# Patient Record
Sex: Female | Born: 1945 | Race: White | Hispanic: No | Marital: Married | Smoking: Never smoker
Health system: Northeastern US, Academic
[De-identification: ages and names within clinical notes are randomized; demographics above are authoritative.]

---

## 2015-12-08 ENCOUNTER — Ambulatory Visit

## 2015-12-08 LAB — HX VITAMIN D 25 HYDROXY LEVEL (RECOMMENDED)
CASE NUMBER: 2017135000810
HX VITAMIN D 25 OH LVL: 43 ng/mL — NL (ref 30.0–100.0)

## 2015-12-12 ENCOUNTER — Ambulatory Visit

## 2015-12-12 LAB — HX GLOMERULAR FILTRATION RATE (ESTIMATED)
CASE NUMBER: 2017139001103
HX AFN AMER GLOMERULAR FILTRATION RATE: 70 mL/min/{1.73_m2}
HX NON-AFN AMER GLOMERULAR FILTRATION RATE: 60 mL/min/{1.73_m2}

## 2015-12-12 LAB — HX CBC W/ INDICES
CASE NUMBER: 2017139001103
HX HCT: 46.2 % — NL (ref 36.0–47.0)
HX HGB: 15 g/dL — NL (ref 11.8–15.8)
HX MCH: 28 pg — NL (ref 27.0–31.0)
HX MCHC: 32.5 g/dL — NL (ref 32.0–36.0)
HX MCV: 88 fL — NL (ref 81.0–99.0)
HX MPV: 9.5 fL — NL (ref 7.4–11.5)
HX NRBC PERCENT: 0 % — NL
HX PLATELET: 278 10*3/uL — NL (ref 150.0–400.0)
HX RBC: 5.28 10*6/uL — ABNORMAL HIGH (ref 3.6–5.0)
HX RDW: 13 % — NL (ref 11.5–14.5)
HX WBC: 5.6 10*3/uL — NL (ref 3.7–11.2)

## 2015-12-12 LAB — HX COMPREHENSIVE METABOLIC PANEL
CASE NUMBER: 2017139001103
HX ALBUMIN LVL: 4.1 g/dL — NL (ref 3.5–5.0)
HX ALKALINE PHOSPHATASE: 85 U/L — NL (ref 45.0–117.0)
HX ALT: 24 U/L — NL (ref 6.0–78.0)
HX ANION GAP: 5 — NL (ref 3.0–11.0)
HX AST: 16 U/L — NL (ref 6.0–40.0)
HX BILIRUBIN TOTAL: 0.5 mg/dL — NL (ref 0.2–1.0)
HX BUN: 13 mg/dL — NL (ref 7.0–18.0)
HX CALCIUM LVL: 9.4 mg/dL — NL (ref 8.5–10.1)
HX CHLORIDE: 106 mmol/L — NL (ref 98.0–107.0)
HX CO2: 32 mmol/L — NL (ref 21.0–32.0)
HX CREATININE: 0.967 mg/dL — NL (ref 0.55–1.3)
HX GLUCOSE LVL: 94 mg/dL — NL (ref 65.0–110.0)
HX POTASSIUM LVL: 4.6 mmol/L — NL (ref 3.6–5.2)
HX SODIUM LVL: 143 mmol/L — NL (ref 136.0–145.0)
HX TOTAL PROTEIN: 7.5 g/dL — NL (ref 6.0–8.0)

## 2016-01-26 ENCOUNTER — Ambulatory Visit

## 2016-02-05 ENCOUNTER — Ambulatory Visit (HOSPITAL_BASED_OUTPATIENT_CLINIC_OR_DEPARTMENT_OTHER): Admitting: Family Medicine

## 2016-02-05 NOTE — Progress Notes (Signed)
** Progress Notes  **    ---    **Patient:** Hannah [name], Hannah [name]    **Account Number:** [id number]    **Provider:** Electa Sniff, M.D    **DOB:** Apr 29, 1946  **Age:** 70 Y  **Sex:** Female    **Date:** 02/05/2016    **Phone:** [phone]    **Address:** [address], [place], [id number]        * * *        **Subjective:**        ---      **Chief Complaints:**    ------      1\. [facility] - NEW PATIENT-see labs results pt brought in from 12/10/15. 2.  MSSP. 3. PHQ 9-completed and put in chart. 4. colonscopy hx-has had in last 5  years-per GI office, Dr. Arletta Bale did it in 2012-notes to follow, due  06/2016. . 5. Adacel-thinks she had done-need to check old records, PCV  13-thinks she had done, Rx for shingles vaccine-says had the vaccine and two  years later had the shingles across her abdoman. 6. discuss PPI info-been on  Omeprazole for 15-20 years, works perfectly for her. 7. Pressure in chest last  week, lasted seconds-was lying down, had eaten 1-2 hours before.    ------      **HPI:**    _Interim History_ :    70 year old female presents with c/o Consultations  01/20/16 derm consult-see  scanned note.  Marland Kitchen    Here for physical at age 70. new patient. hx of left acoustic neuroma, chronic  GERD, depression/anxiety, lichen sclerosis, hyperlipidemia. no recent hospital  stays. recently started on statin. chest pain a few weeks ago, transient.  better now.    _PHQ Form_ :    PHQ9 Form Little interest or pleasure in doing things Not at all, Feeling  down, depressed, hopeless Not at all, Trouble falling or staying asleep, or  sleeping to much Not at all, Feeling tired or little energy Not at all, Poor  appetite or overeating Not at all, Feeling bad about yourself-or that you are  a failure or let yourself or your family down Not at all, Trouble  concentrating on things such as reading the newspaper or watching television  Not at all, Moving or speaking so slowly that other people could have noticed.  or the opposite -  being so fidgety or restless that you have been moving  around a lot more than usual Not at all, Thoughts that you were better off  dead, or of hurting yourself in someway? Not at all, Total score 0\.    _* Updated MSSP_ :    c/o Influenza Vaccination (08/01-03/31) of current year) Documented: says has  every year. c/o Colorectal Cancer Screening (50-75) Performed: per pt has had  one in the last 5 years with Arletta Bale, mother with colon cancer . c/o Annual  Fall Screen (65 or Older) Have you fallen in the past year? No, Patient  screened for falls on: 02/05/2016. c/o Cost of Medication Cost of medication  discussed on 01/2016 no trouble .    ------      **ROS:**    _CONSTITUTIONAL_ :    no  Fever.  no  Chills.    _ENT_ :    no  Rhinorrhea. Ringing in Ears  yes  . Hearing Loss  yes  .    _RESPIRATORY_ :    no  Shortness of Breath.  no  Persistent Cough.  no  Wheezing.    _CARDIOLOGY_ :  c/o  Chest Pain.  no  Palpitations.  no  Dizziness.  no  Leg Edema.    _GASTROENTEROLOGY_ :    c/o  Heartburn.    _UROLOGY_ :    no  Dysuria.    _MUSCULOSKELETAL_ :    c/o  Back Pain.    _NEUROLOGY_ :    Positive for  left eyelid drop  .  no  Tingling/Numbness.    _PSYCHOLOGY_ :    c/o  Depression.  c/o  Anxiety.    _DERMATOLOGY_ :    Positive for  squamous cell cancer of leg.  .        ------      **Medical History:** Transient global amnesia, acoustic Neuroma,  Osteoporosis, chronic GERD, Migraines, Lichen sclerosis, Left breast ductal  hyperplasia, Tinnitus, Diverticulosis- 9 inches of colon removed, colonoscopy:  07/14/11 with Dr.Marinelli, repeat in 5 years. mother had colon cancer,  Plantar fasciitis.    ------      **Gyn History:** Periods : Hyst 2010. Sexual activity currently sexually  active. Last pap smear date 12/2013. Last mammogram date 06/2015 normal;  03/2013. Abnormal pap smear none. Date of Last Period 1996. STD none. HPV no.  HSV no. DES exposure no. HIV no. has own gyn was pt of jch. Other bone density  2013  osteoporosis.    ------      **OB History:** Total pregnancies 3\. Total living children 3\. Gravida 3\.  Para 3\. Miscarriage(s) 0\. Pregnancy # 1: NSVD. Pregnancy # 2: NSVD.  Pregnancy # 3 NSVD.    ------      **Surgical History:** tonsillectomy/adenoidectomy (as a child) ,  appendectomy 1971, rectal fissurectomies 1973, diagnostic lap (corpus luteum)  1981, Vag hyst BSO post colporrhaphy with culdoplasty (pelvic relaxation)  1999, acoustic neuroma .    ------      **Hospitalization/Major Diagnostic Procedure:** # 3 vaginal deliveried ,  See Surgical hx .    ------      **Family History:**    colon cancer in mother. father healthy.    ------      **Social History:** Smoking  Status: never smoker  , Patient counseled on  the dangers of tobacco use: 02/05/2016. no Alcohol. no Drug use. Exercise:  yes, walking, swimming off and on. Children: 3 girls. Occupation: retired.  Caffeine: decaf.    ------      **Medications:** Taking omeprazole 20 mg delayed release capsule 1 orally  qd, Taking paroxetine 30 mg tablet 1 orally qd, Taking nystatin topical 100000  units/g cream applied topically bid x 2 weeks and prn, Taking clobetasol  topical 0.05% ointment applied topically bid to affected area x 2 weeks and  prn, Taking Estrace Vaginal 1 gm cream intravaginally 2 x weekly, Taking  atorvastatin 20 mg tablet 1 tab(s) orally once a day, Taking risperidone 0.25  mg tablet 1 tab(s) orally qd, Taking calcium carbonate 600 mg tablet 2 tab(s)  orally daily, Taking Vitamin B12 500 mcg tablet 1 tab(s) orally once a day,  Taking Vitamin D3 2000 intl units tablet 1 tab(s) orally once a day, Taking  Zomig 5 mg tablet 1 tab(s) orally once a day, Taking lamotrigine 100 mg tablet  1 tab(s) orally qd, Taking lamotrigine 25 mg tablet 1 tab(s) orally qd,  Discontinued Lamictal 100 mg tablet 1 tab(s) orally qd, Discontinued Respirol  , Discontinued aspirin 81 mg tablet, chewable 1 orally qd, Medication List  reviewed and  reconciled with the patient    ------      **  Allergies:** Codeine, Sulfamethizole.    ------        **Objective:**        ---      **Vitals:** HR 94, BP 130/80, O2 Sat. 95%RA, Ht 59.5, Wt 178, BMI  35.35  .    ------      **Examination:    ** _General Examination:_    General Appearance:  alert and oriented  ,  well nourished and hydrated  ,  appropriate attire and affect  .    HEENT:  left eyelid droop, complete hearing loss left ear  .    Oral cavity:  clear  ,  moist mucus membranes  .    Heart:  RRR  ,  no murmurs  .    Lungs:  clear to auscultation bilaterally  ,  no wheezes/rhonchi/rales  .    Abdomen:  soft, NT/ND, BS present  ,  no masses palpated  ,  no  hepatosplenomegaly  .    Back:  normal ROM of spine  .    Female Genitourinary:  per gyn  .    Extremities  no clubbing, no edema  .    Peripheral pulses:  normal (2+) bilaterally  .    Skin:  moist, warm  .    Neurologic Exam:  balance disturbance. left hearing loss and left eyelid droop  .        ------            **Assessment:**        ---      **Assessment:**    1\. Acoustic neuroma - D33.3 (Primary)    2\. Hyperlipidemia - E78.5    3\. Family hx of colon cancer - Z80.0    4\. Osteopenia determined by x-ray - M85.80    5\. Chronic GERD - K21.9    6\. Depression with anxiety - F41.8    7\. Vitamin D deficiency - E55.9    8\. Lichen sclerosus - L90.0    9\. Adult general medical exam - Z00.00    ------        **Plan:**        ---        **1\. Acoustic neuroma**    Notes: left sided. had surgery in past. left with left ear deafness, balance  disturbance, left eyelid droop.    ---    **2\. Hyperlipidemia**    _LAB: Hepatic Function Panel - [facility]_ protein 8.3    Albumin Lvl    4.0      3.5-5.0 - Gm/dL    ------------    Alk Phos    87      45-117 - Units/L    ------------    ALT    29      6-78 - Units/L    ------------    AST    24      6-40 - Units/L    ------------    Bili Direct    0.1      0.0-0.3 - mg/dL     ------------    Bili Total    0.6      0.2-1.0 - mg/dL    ------------    Total Protein    8.3    H    6.0-8.0 - Gm/dL    ------------    _LAB: Lipid Panel_ 266/65/169/161  Chol    266    H    140-200 - mg/dL    ------------  HDL    65    H    41-60 - mg/dL    ------------    LDL    169    H    0-129 - mg/dL    ------------    Status    Fasting      \-    ------------    Trig    161    H    0-149 - mg/dL    ------------        _Imaging: EKG_ Normal    Dejay Kronk G 02/06/2016 10:08:01 AM >    ------        Notes: lipids fine when last checked. recheck off statin. EKG normal.    **3\. Family hx of colon cancer**    Notes: due for colonoscopy in December.    **4\. Osteopenia determined by x-ray**    Notes: calcium, exercise, vitamin d.    **5\. Chronic GERD**    Notes: on PPI long term, unable to stop.    **6\. Depression with anxiety**    Notes: paxil, lamictal, can try to taper respirdol, causes weight gain and  high lipids.    **7\. Vitamin D deficiency**    _LAB: Vitamin D 25 Hydroxy Level_ 41    Vitamin D 25 OH Lvl    41      30-100 - nGm/ml    ------------        Notes: recheck. was on supplements.    Referral ZO:[facility]    Reason:needs repeat colonoscopyfam hx of colon cancer        **8\. Lichen sclerosus**    Notes: per gyn.    **9\. Others**    Notes: Patient Educated with: TDAP - ADACEL.pdf (TDAP - ADACEL.pdf) Patient  Educated with: Shingles.pdf (Shingles.pdf) Patient Educated with:  Pneumococcal- PCV13.pdf (Pneumococcal- PCV13.pdf) generally healthy. mammo up  to date. colonoscopy due December for fam hx of colon cancer. recommend diet,  exercise program. recent labs reviewed. all normal.      **Procedure Codes:** 93000 ECG W/ INTERP    ------      **Follow Up:** 3 Months, prn    ------    ---    ---          **Provider:** Electa Sniff, M.D    ---    **Patient:** Kantz, Shalane A **DOB:** 10/01/1945 **Date:**  02/05/2016    ---    Electronically signed by Kyung Rudd on 05/24/2016 at 06:21 PM EDT    Sign off status: Completed

## 2016-02-23 ENCOUNTER — Ambulatory Visit: Admitting: Family Medicine

## 2016-02-23 LAB — HX HEPATIC FUNCTION PANEL
CASE NUMBER: 2017212000898
HX ALBUMIN LVL: 4 g/dL — NL (ref 3.5–5.0)
HX ALKALINE PHOSPHATASE: 87 U/L — NL (ref 45.0–117.0)
HX ALT: 29 U/L — NL (ref 6.0–78.0)
HX AST: 24 U/L — NL (ref 6.0–40.0)
HX BILIRUBIN DIRECT: 0.1 mg/dL — NL (ref 0.0–0.3)
HX BILIRUBIN TOTAL: 0.6 mg/dL — NL (ref 0.2–1.0)
HX TOTAL PROTEIN: 8.3 g/dL — ABNORMAL HIGH (ref 6.0–8.0)

## 2016-02-23 LAB — HX LIPID PANEL
CASE NUMBER: 2017212000898
HX CHOL: 266 mg/dL — ABNORMAL HIGH (ref 140.0–200.0)
HX HDL: 65 mg/dL — ABNORMAL HIGH (ref 41.0–60.0)
HX LDL: 169 mg/dL — ABNORMAL HIGH (ref 0.0–129.0)
HX TRIG: 161 mg/dL — ABNORMAL HIGH (ref 0.0–149.0)

## 2016-02-23 LAB — HX VITAMIN D 25 HYDROXY LEVEL (RECOMMENDED)
CASE NUMBER: 2017212000898
HX VITAMIN D 25 OH LVL: 41 ng/mL — NL (ref 30.0–100.0)

## 2016-02-24 ENCOUNTER — Ambulatory Visit (HOSPITAL_BASED_OUTPATIENT_CLINIC_OR_DEPARTMENT_OTHER): Admitting: Family Medicine

## 2016-02-24 NOTE — Progress Notes (Signed)
* * *        **  Hannah [name], Hannah [name]**    ------    48 Y old Female, DOB: [birth year]    [address], [place], [place] [address]    Home: [phone]    Provider: Cheryle Horsfall        * * *    Telephone Encounter    ---    Answered by    Cheryle Horsfall    Date: 02/24/2016        Time: 05:41 PM    Reason    results    ------            Message                      lipids high.  LFTs OK except protein high.  vitamin D better                Action Taken    Montrail Mehrer G 02/24/2016 5:50:46 PM > would restart statin.  called. she has been feeling poorly with headaches and fatigue. consider Lyme.  coming in Monday                * * *                ---          * * *          Patient: Hannah [name], Hannah [name] DOB: October 10, 1945 Provider: Cheryle Horsfall  02/24/2016    ---    Note generated by eClinicalWorks EMR/PM Software ([email or web address])

## 2016-02-25 ENCOUNTER — Ambulatory Visit (HOSPITAL_BASED_OUTPATIENT_CLINIC_OR_DEPARTMENT_OTHER): Admitting: Family Medicine

## 2016-02-25 ENCOUNTER — Ambulatory Visit: Admitting: Family Medicine

## 2016-02-25 LAB — HX CBC W/ DIFF
CASE NUMBER: 2017214002300
HX HCT: 49.7 % — ABNORMAL HIGH (ref 36.0–47.0)
HX HGB: 16.1 g/dL — ABNORMAL HIGH (ref 11.8–15.8)
HX MCH: 28 pg — NL (ref 27.0–31.0)
HX MCHC: 32.4 g/dL — NL (ref 32.0–36.0)
HX MCV: 88 fL — NL (ref 81.0–99.0)
HX MPV: 9.6 fL — NL (ref 7.4–11.5)
HX NRBC PERCENT: 0 % — NL
HX PLATELET: 320 10*3/uL — NL (ref 150.0–400.0)
HX RBC: 5.68 10*6/uL — ABNORMAL HIGH (ref 3.6–5.0)
HX RDW: 13.2 % — NL (ref 11.5–14.5)
HX WBC: 7 10*3/uL — NL (ref 3.7–11.2)

## 2016-02-25 LAB — HX .AUTOMATED DIFF
CASE NUMBER: 2017214002300
HX ABSOLUTE NEUTRO COUNT: 3900 /mm3
HX BASOPHILS: 1 % — NL (ref 0.0–1.0)
HX EOSINOPHILS: 1 % — NL (ref 0.0–3.0)
HX IMMATURE GRANULOCYTES: 0 % — NL (ref 0.0–2.0)
HX LYMPHOCYTES: 32 % — NL (ref 22.0–40.0)
HX MONOCYTES: 10 % — NL (ref 0.0–11.0)
HX NEU CT MEASURED: 3.9
HX NEUTROPHILS: 56 % — NL (ref 40.0–71.0)

## 2016-02-25 LAB — HX TSH
CASE NUMBER: 2017214002300
HX 3RD GEN TSH: 1.02 u[IU]/mL — NL (ref 0.358–3.74)

## 2016-02-25 NOTE — Progress Notes (Signed)
* * *        **  [name], [name]**    ------    88 Y old Female, DOB: [birth year]    [address], [place], [place] [address]    Home: [phone]    Provider: Cheryle Horsfall        * * *    Telephone Encounter    ---    Answered by    Cheryle Horsfall    Date: 02/25/2016        Time: 04:17 PM    Message                      paxil        ------            Action Taken    Brynnlee Cumpian G 02/25/2016 5:26:50 PM > , erefill done            Refills    Start Paroxetine Hydrochloride tablet, 40 mg, orally, 90, 1  tab(s), once a day, Refills=1    ------          * * *                ---          * * *          Patient: [name], [name] DOB: 1945/10/27 Provider: Cheryle Horsfall  02/25/2016    ---    Note generated by eClinicalWorks EMR/PM Software ([email or web address])

## 2016-02-25 NOTE — Progress Notes (Signed)
** Progress Note  **    ---    **Patient:** Hannah [name], Hannah [name]    **Account Number:** [id number]    **Provider:** Electa Sniff, M.D    **DOB:** July 28, 1945  **Age:** 59 Y  **Sex:** Female    **Date:** 02/25/2016    **Phone:** [phone]    **Address:** [address], [place], [phone]        * * *        **Subjective:**        ---      **Chief Complaints:**    ------      1\. Wound on right leg not healing correctly. 2. Pt states she is also  sweatting alot feels dizzy at times.    ------      **HPI:**    _Sick Visit_ :    as above. has had right leg wound for the past 3 weeks. tender, not healing.  also has had posterior headaches. fatigue. no motivation. tearful at times. no  fever, no chills. does have sweats. tx for depression over the years. did very  well on paxil initially, worse when she tapered this medication.    ------      **ROS:**    _CONSTITUTIONAL_ :    no  Fever.  no  Chills.    _ENT_ :    no  Rhinorrhea. Ringing in Ears  yes  . Hearing Loss  yes  .    _RESPIRATORY_ :    no  Shortness of Breath.  no  Persistent Cough.  no  Wheezing.    _CARDIOLOGY_ :    c/o  Chest Pain.  no  Palpitations.  no  Dizziness.  no  Leg Edema.    _GASTROENTEROLOGY_ :    c/o  Heartburn.    _UROLOGY_ :    no  Dysuria.    _MUSCULOSKELETAL_ :    c/o  Back Pain.    _NEUROLOGY_ :    Positive for  left eyelid drop  .  no  Tingling/Numbness.    _PSYCHOLOGY_ :    c/o  Depression.  c/o  Anxiety.    _DERMATOLOGY_ :    Positive for  squamous cell cancer of leg.  .        ------      **Medical History:** Transient global amnesia, acoustic Neuroma,  Osteoporosis, chronic GERD, Migraines, Lichen sclerosis, Left breast ductal  hyperplasia, Tinnitus, Diverticulosis- 9 inches of colon removed, colonoscopy:  07/14/11 with Dr.Marinelli, repeat in 5 years. mother had colon cancer,  Plantar fasciitis.    ------      **Gyn History:** Periods : Hyst 2010. Sexual activity currently sexually  active. Last pap smear date 12/2013. Last  mammogram date 06/2015 normal;  03/2013. Abnormal pap smear none. Date of Last Period 1996. STD none. HPV no.  HSV no. DES exposure no. HIV no. has own gyn was pt of jch. Other bone density  2013 osteoporosis.    ------      **OB History:** Total pregnancies 3\. Total living children 3\. Gravida 3\.  Para 3\. Miscarriage(s) 0\. Pregnancy # 1: NSVD. Pregnancy # 2: NSVD.  Pregnancy # 3 NSVD.    ------      **Family History:**    colon cancer in mother. father healthy.    ------      **Social History:** Smoking  Status: never smoker  , Patient counseled on  the dangers of tobacco use: 02/05/2016. no Alcohol. no Drug use. Exercise:  yes, walking, swimming off and  on. Children: 3 girls. Occupation: retired.  Caffeine: decaf.    ------      **Medications:** Taking omeprazole 20 mg delayed release capsule 1 orally  qd, Taking paroxetine 30 mg tablet 1 orally qd, Taking nystatin topical 100000  units/g cream applied topically bid x 2 weeks and prn, Taking clobetasol  topical 0.05% ointment applied topically bid to affected area x 2 weeks and  prn, Taking Estrace Vaginal 1 gm cream intravaginally 2 x weekly, Taking  risperidone 0.25 mg tablet 1 tab(s) orally qd, Taking calcium carbonate 600 mg  tablet 2 tab(s) orally daily, Taking Vitamin B12 500 mcg tablet 1 tab(s)  orally once [name] day, Taking Vitamin D3 2000 intl units tablet 1 tab(s) orally  once [name] day, Taking Zomig 5 mg tablet 1 tab(s) orally once [name] day, Taking  lamotrigine 100 mg tablet 1 tab(s) orally qd, Taking lamotrigine 25 mg tablet  1 tab(s) orally qd, Discontinued atorvastatin 20 mg tablet 1 tab(s) orally  once [name] day, Medication List reviewed and reconciled with the patient    ------      **Allergies:** Codeine, Sulfamethizole.    ------        **Objective:**        ---      **Vitals:** BP 132/84, Temp 98.7, Ht 59.5, Wt 175, Wt Change -3 lb, BMI  34.75  .    ------      **Examination:    ** _General Examination:_    General Appearance:  alert and  oriented  ,  well nourished and hydrated  ,  appropriate attire and affect  .    HEENT:  left eyelid droop, complete hearing loss left ear  .    Oral cavity:  clear  ,  moist mucus membranes  .    Heart:  RRR  ,  no murmurs  .    Lungs:  clear to auscultation bilaterally  ,  no wheezes/rhonchi/rales  .    Abdomen:  soft, NT/ND, BS present  ,  no masses palpated  ,  no  hepatosplenomegaly  .    Back:  normal ROM of spine  .    Female Genitourinary:  per gyn  .    Extremities  no clubbing, no edema  .    Peripheral pulses:  normal (2+) bilaterally  .    Skin:  moist, warm  .    Neurologic Exam:  balance disturbance. left hearing loss and left eyelid droop  .        ------            **Assessment:**        ---      **Assessment:**    1\. Skin infection - L08.9 (Primary)    2\. Depression - F32.9    ------        **Plan:**        ---        **1\. Skin infection**    _LAB: CBC w/ Differential - [facility]_   Hct    49.7    H    36.0-47.0 - %    ------------    Hgb    16.1    H    11.8-15.8 - Gm/dL    ------------    MCH    28      27-31 - pGm    ------------    MCHC    32.4      32.0-36.0 - Gm/dL    ------------  MCV    88      81-99 - fL    ------------    MPV    9.6      7.4-11.5 - fL    ------------    Platelet    320      150-400 - thous/mm3    ------------    RBC    5.68    H    3.60-5.00 - Mil/mm3    ------------    RDW    13.2      11.5-14.5 - %    ------------    WBC    7.0      3.7-11.2 - thous/mm3    ------------    _LAB: Lyme Antibodies_    Notes: 3 weeks of nonhealing wound right leg. nondraining. check Lyme with  wound, headache, fatigue.    **2\. Depression**    _LAB: 3 Gen Sensitive TSH_   3rd Gen TSH    1.020      0.358-3.740 -  mclU/ml    ------------        Notes: clearly depressed. increase paxil to 40 mg and counselling recommended.  check labs/thyroid and tx if abnormal.    **3\. Others**    Notes: christine svenson is her [place]  psych NP: [phone]  [email or web address] gary rose is her counsellor: [phone].      **Follow Up:** after test reports return    ------    ---    ---          **Provider:** Electa Sniff, M.D    ---    **Patient:** Hannah [name], Hannah [name] **DOB:** [birth year] **Date:** 02/25/2016    ---    Electronically signed by Electa Sniff , MD on 02/26/2016 at 08:08 AM EDT    Sign off status: Completed

## 2016-02-26 ENCOUNTER — Ambulatory Visit (HOSPITAL_BASED_OUTPATIENT_CLINIC_OR_DEPARTMENT_OTHER): Admitting: Family Medicine

## 2016-02-27 ENCOUNTER — Ambulatory Visit (HOSPITAL_BASED_OUTPATIENT_CLINIC_OR_DEPARTMENT_OTHER): Admitting: Family Medicine

## 2016-02-27 LAB — HX LYME ANTIBODIES W/ RFLX IB
CASE NUMBER: 2017214002300
HX LYME ABS TOT: 0.27 {index}

## 2016-02-27 NOTE — Progress Notes (Signed)
* * *        **  [name], [name]**    ------    6 Y old Female, DOB: 08-28-1945    [address], [place], [place] [address]    Home: [phone]    Provider: Cheryle Horsfall        * * *    Telephone Encounter    ---    Answered by    Cheryle Horsfall    Date: 02/27/2016        Time: 08:09 AM    Reason    results    ------            Message                      cbc normal and Lyme negative                Action Taken    Viraat Vanpatten G 03/01/2016 7:47:51 AM > will discuss today when  husband comes in                * * *                ---          * * *          Patient: [name], [name] DOB: 1945/09/11 Provider: Cheryle Horsfall  02/27/2016    ---    Note generated by eClinicalWorks EMR/PM Software ([email or web address])

## 2016-03-01 ENCOUNTER — Ambulatory Visit (HOSPITAL_BASED_OUTPATIENT_CLINIC_OR_DEPARTMENT_OTHER): Admitting: Family Medicine

## 2016-03-01 NOTE — Progress Notes (Signed)
* * *        **  Hannah [name], Hannah [name]**    ------    89 Y old Female, DOB: January 17, 1946    [address], [place], [place] [address]    Home: [phone]    Provider: Cheryle Horsfall        * * *    Web Encounter    ---    Answered by    Staff, Clinical    Date: 03/01/2016        Time: 06:41 AM    Caller    Tiann Saville    ------            Reason    New Refill Request            Message                      Please answer the following questions            Medication:  omeprazole 20 mg 1  orally  once [name] day   with no refill(s)      Number of refills requested:   90 Capsules (90 day) supply                              Contact phone number (area code & phone number):            Comments:                Action Taken    Lynden Flemmer G 03/01/2016 7:37:40 AM > erx done            Refills    Start omeprazole delayed release capsule, 20 mg, orally, 90, 1  cap(s), once [name] day, Refills=1    ------          * * *                ---          * * *          Patient: Hannah [name], Hannah [name] DOB: 12-Jul-1946 Provider: Cheryle Horsfall  03/01/2016    ---    Note generated by eClinicalWorks EMR/PM Software ([email or web address])

## 2016-03-04 ENCOUNTER — Ambulatory Visit

## 2016-03-12 ENCOUNTER — Ambulatory Visit (HOSPITAL_BASED_OUTPATIENT_CLINIC_OR_DEPARTMENT_OTHER): Admitting: Family Medicine

## 2016-03-12 NOTE — Progress Notes (Signed)
* * *        **  [name], [name]**    ------    50 Y old Female, DOB: [birth year]    [address], [place], [place] [address]    Home: [phone]    Provider: Cheryle Horsfall        * * *    Telephone Encounter    ---    Answered by    Cheryle Horsfall    Date: 03/12/2016        Time: 08:48 AM    Action Taken    Cheryle Horsfall 03/12/2016 8:48:58 AM > LM with her psych  nurse practitioner about paxil    ------                * * *                ---          * * *          Patient: Hannah [name], Hannah [name] DOB: 1945-09-06 Provider: Cheryle Horsfall  03/12/2016    ---    Note generated by eClinicalWorks EMR/PM Software ([email or web address])

## 2016-06-08 ENCOUNTER — Ambulatory Visit (HOSPITAL_BASED_OUTPATIENT_CLINIC_OR_DEPARTMENT_OTHER): Admitting: Family Medicine

## 2016-06-08 NOTE — Progress Notes (Signed)
* * *        **  [name], [name]**    ------    3 Y old Female, DOB: 1946-04-10    [address], [place], [place] [address]    Home: [phone]    Provider: Cheryle Horsfall        * * *    Telephone Encounter    ---    Answered by    Idelia Salm    Date: 06/08/2016        Time: 08:31 AM    Message                      Pt. called to request a refill on this med,It says discontinued when I looked to see when it was last filled?      last visit 02/25/16        ------            Action Taken    Orabelle Rylee G 06/08/2016 8:40:51 AM > was off it, we  restarted when cholesterol was high. I think she lives in [place] for the  winter though. Is above pharmacy correct? Luna,Deborah 06/08/2016 9:07:28 AM >  Yes I checked with her,she said [facility] G 06/08/2016  10:03:31 AM > erx done            Refills    Refill atorvastatin tablet, 20 mg, orally, 90, 1 tab(s), once a  day, Refills=3    ------          * * *                ---          * * *          Patient: [name], [name] DOB: 12-29-1945 Provider: Cheryle Horsfall  06/08/2016    ---    Note generated by eClinicalWorks EMR/PM Software ([email or web address])

## 2016-06-09 ENCOUNTER — Ambulatory Visit (HOSPITAL_BASED_OUTPATIENT_CLINIC_OR_DEPARTMENT_OTHER): Admitting: Family Medicine

## 2016-06-09 NOTE — Progress Notes (Signed)
* * *        **  [name], [name]**    ------    80 Y old Female, DOB: 1945/12/24    [address], [place], [place] [address]    Home: [phone]    Provider: Cheryle Horsfall        * * *    Web Encounter    ---    Answered by    Jule Economy    Date: 06/09/2016        Time: 12:22 PM    Caller    Dessa Papaleo    ------            Reason    Update Demographics - Additional Info            Message                      Please Update Demographic Information                    * * *                ---          * * *          Patient: [name], [name] DOB: [birth year] Provider: Cheryle Horsfall  06/09/2016    ---    Note generated by eClinicalWorks EMR/PM Software ([email or web address])

## 2016-06-09 NOTE — Progress Notes (Signed)
* * *        **  [name], Hannah [name]**    ------    38 Y old Female, DOB: 27-Sep-1945    [address], [place], [place] [address]    Home: [phone]    Provider: Cheryle Horsfall        * * *    Web Encounter    ---    Answered by    Jule Economy    Date: 06/09/2016        Time: 12:22 PM    Caller    Noga Newkirk    ------            Reason    Update Demographics - Personal Info            Message                      Please Update Demographic Information                    * * *                ---          * * *          Patient: [name], Hannah [name] DOB: 1945-10-30 Provider: Cheryle Horsfall  06/09/2016    ---    Note generated by eClinicalWorks EMR/PM Software ([email or web address])

## 2016-07-17 ENCOUNTER — Ambulatory Visit (HOSPITAL_BASED_OUTPATIENT_CLINIC_OR_DEPARTMENT_OTHER): Admitting: Family Medicine

## 2016-07-17 ENCOUNTER — Ambulatory Visit: Admitting: Family Medicine

## 2016-07-22 ENCOUNTER — Ambulatory Visit (HOSPITAL_BASED_OUTPATIENT_CLINIC_OR_DEPARTMENT_OTHER): Admitting: Family Medicine

## 2016-07-27 ENCOUNTER — Ambulatory Visit

## 2016-12-06 ENCOUNTER — Ambulatory Visit (HOSPITAL_BASED_OUTPATIENT_CLINIC_OR_DEPARTMENT_OTHER): Admitting: Family Medicine

## 2016-12-06 NOTE — Progress Notes (Signed)
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  States that the provider she saw recommended pain management to her.  She wants to get referrals/recommendations from you.  States this has been going on for about 2 months now.  I scheduled her an appt with you 12/15/16 at 10:30am, as she is returning from [facility] later this week.  I asked her if she had MRI report with her, she states she is going to contact the office where she was seen and have them fax information to you.                Action Taken    Lilymarie Scroggins G 12/06/2016 3:07:36 PM > OK will set her up  with br Bhatt or dr Gerilyn Nestle when she comes in                * * *                ---          * * *          Patient: [name], Hannah [name] DOB: [birth year] Provider: Cheryle Horsfall  12/06/2016    ---    Note generated by eClinicalWorks EMR/PM Software ([email or web address])

## 2016-12-15 ENCOUNTER — Ambulatory Visit (HOSPITAL_BASED_OUTPATIENT_CLINIC_OR_DEPARTMENT_OTHER): Admitting: Family Medicine

## 2016-12-15 NOTE — Progress Notes (Signed)
** Progress Note  **    ---    **Patient:** Hannah [name], Hannah [name]    **Account Number:** [id number]    **Provider:** Electa Sniff, M.D    **DOB:** [birth year]  **Age:** 24 Y  **Sex:** Female    **Date:** 12/15/2016    **Phone:** [phone]    **Address:** [address], [place], [id number]        * * *        **Subjective:**        ---      **Chief Complaints:**    ------      1\. dx w/disc bulge/stenosis in [place], looking for referral to ortho / PHQ9  /[facility]. 2. Due for repeat colonoscopy. 3. Has reports with her.    ------      **HPI:**    _Follow-up_ :    back from [place]. has had three months of back pain. radiates down left >  right leg. some tingling, numbness, weakness. was treated breifly with  steorids and saw a chiropractor. no relief. no hx of injury. 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    _Health Screening_ :    Medication Reconciliation Post-Discharge Has the patient recently been  discharged from an inpatient faclity (hospital, skilled nursing home, rehab)  and seen within 30 days? No. Fall Risk Screening Have you fallen in the past  year? _. Diabetes Composite Is the patient's A1C in control (</=9)? _. CAD  and/or IVD Is the patient on an aspirin or anti-platelet? _. Depression Was  the patient screened for depression in the current year? _. Breast Cancer  Screening Patient screened within 2 years: Yes, Normal, Date of examination:  07/20/2016. Colorectal Cancer Screening Patient had: Colonoscopy within 9  years, Performed on: 07/14/11. Influenza Vaccination (08/01-03/31) Documented  for current year(administered in office or elsewhere) _. Pneumonia Vaccination  Documented (administered in office or elsewhere): _. BMI Screening (18.5-24.9)  Within range: _. Tobacco Use Was the patient screened for tobacco use (current  year): _. Statin Therapy Does the patient have any of the following: Familial  or pure hypercholesterolemia (E78.0,E78.00,E78.01), Is the patient on a  statin? Yes.  Cost of Medication Cost of medication discussed on: / .    ------      **Medical History:** Transient global amnesia, acoustic Neuroma,  Osteoporosis, chronic GERD, Migraines, Lichen sclerosis, Left breast ductal  hyperplasia, Tinnitus, Diverticulosis- 9 inches of colon removed, colonoscopy:  07/14/11 with Dr.Marinelli, repeat in 5 years. mother had colon cancer,  Plantar fasciitis.    ------      **Gyn History:** Periods : Hyst 2010. Sexual activity currently sexually  active. Last pap smear date 12/2013. Last mammogram date 07/20/16 normal,  06/2015 normal, 03/2013. Abnormal pap smear none. Date of Last Period 1996. STD  none. HPV no. HSV no. DES exposure no. HIV no. has own gyn was pt of jch.  Other bone density 2013 osteoporosis.    ------      **OB History:** Total pregnancies 3\. Total living children 3\. Gravida 3\.  Para 3\. Miscarriage(s) 0\. Pregnancy # 1: NSVD. Pregnancy # 2: NSVD.  Pregnancy # 3 NSVD.    ------      **Surgical History:** tonsillectomy/adenoidectomy (as a child) ,  appendectomy 1971, rectal fissurectomies 1973, diagnostic lap (corpus luteum)  1981, Vag hyst BSO post colporrhaphy with culdoplasty (pelvic relaxation)  1999, acoustic neuroma .    ------      **Hospitalization/Major Diagnostic Procedure:** # 3 vaginal deliveried ,  See Surgical  hx .    ------      **Family History:**    colon cancer in mother. father healthy.    ------      **Social History:** Smoking  Status: never smoker  , Patient counseled on  the dangers of tobacco use: 02/05/2016. no Alcohol. no Drug use. Exercise:  yes, walking, swimming off and on. Children: 3 girls. Occupation: retired.  Caffeine: decaf.    ------      **Medications:** Taking omeprazole 20 mg delayed release capsule 1 orally  qd, Taking paroxetine 10 mg tablet 1 orally qd, Taking clobetasol topical  0.05% ointment applied topically bid to affected area x 2 weeks and prn,  Taking risperidone 0.25 mg tablet 1 tab(s) orally qd, Taking  calcium carbonate  600 mg tablet 2 tab(s) orally daily, Taking Vitamin B12 500 mcg tablet 1  tab(s) orally once a day, Taking Vitamin D3 2000 intl units tablet 1 tab(s)  orally once a day, Taking Zomig 5 mg tablet 1 tab(s) orally once a day, Taking  lamotrigine 100 mg tablet 1 tab(s) orally qd, Taking lamotrigine 25 mg tablet  1 tab(s) orally qd, Taking atorvastatin 20 mg tablet 1 tab(s) orally once a  day, Taking duloxetine 30 mg delayed release capsule orally , Discontinued  nystatin topical 100000 units/g cream applied topically bid x 2 weeks and prn,  Discontinued Estrace Vaginal 1 gm cream intravaginally 2 x weekly,  Discontinued Paroxetine Hydrochloride 40 mg tablet 1 tab(s) orally once a day,  Discontinued omeprazole 20 mg delayed release capsule 1 cap(s) orally once a  day    ------      **Allergies:** Codeine, Sulfamethizole.    ------        **Objective:**        ---      **Vitals:** BP 128/78, Temp 98.7, Ht 59.5, Wt 182, Wt Change 7 lb, BMI  36.14  .    ------      **Examination:    ** _General Examination:_    General  uncomfortable. walks without a cane.  Marland Kitchen    HEENT:  clear conjunctiva  ,  TM's clear and flat bilaterally  ,  nose clear  ,  oropharynx clear with MMM  .    Heart:  RRR  ,  no murmurs  .    Lungs:  clear to auscultation bilaterally  ,  no wheezes/rhonchi/rales  .    Abdomen:  soft, NT/ND, BS present  ,  no masses palpated  ,  no  hepatosplenomegaly  .    Back:  pain with ROM. reflexes diminished. good distal pulse, sensation,  strength. able to toe and heel walk  .    Extremities  tender over lateral left hip  .        ------            **Assessment:**        ---      **Assessment:**    1\. Left lumbar radiculopathy - M54.16 (Primary)    2\. Trochanteric bursitis of left hip - M70.62    ------        **Plan:**        ---        **1\. Left lumbar radiculopathy**    Start Medrol dose pack, as per package, as directed, oral, as per pack, one  dose pack, Refills 0 .    Notes: MRI  reviewed. start formal PT. refer to Dr Nona Dell for consideration of  ESI.  Referral ZO:[facility]    Reason:left leg radiculopathy.        ---    **2\. Trochanteric bursitis of left hip**    Notes: ice, NSAIDs, consider local steroid injection.      **Follow Up:** with ortho    ------    ---    ---          **Provider:** Electa Sniff, M.D    ---    **Patient:** [name], [name] **DOB:** Nov 28, 1945 **Date:** 12/15/2016    ---    Electronically signed by Electa Sniff , MD on 12/20/2016 at 12:48 PM EDT    Sign off status: Completed

## 2016-12-21 ENCOUNTER — Ambulatory Visit

## 2016-12-24 ENCOUNTER — Ambulatory Visit (HOSPITAL_BASED_OUTPATIENT_CLINIC_OR_DEPARTMENT_OTHER): Admitting: Family Medicine

## 2016-12-24 NOTE — Progress Notes (Signed)
* * *        **  [name], [name]**    ------    82 Y old Female, DOB: 1946/05/28    [address], [place], [place] [address]    Home: [phone]    Provider: Cheryle Horsfall        * * *    Telephone Encounter    ---    Answered by    Rock Nephew    Date: 12/24/2016        Time: 10:53 AM    Caller    pt    ------            Reason    refill            Message                      last cpe 01/2016, last refill 03/01/2016.                Action Taken    Braysen Cloward G 12/26/2016 8:49:42 PM > , erefill done            Refills    Start omeprazole delayed release capsule, 20 mg, orally, 90, 1  cap(s), once a day, Refills=2    ------          * * *                ---          * * *          Patient: [name], [name] DOB: 09-22-1945 Provider: Cheryle Horsfall  12/24/2016    ---    Note generated by eClinicalWorks EMR/PM Software ([email or web address])

## 2016-12-27 ENCOUNTER — Ambulatory Visit (HOSPITAL_BASED_OUTPATIENT_CLINIC_OR_DEPARTMENT_OTHER): Admitting: Family Medicine

## 2016-12-27 NOTE — Progress Notes (Signed)
* * *        **  [name], [name]**    ------    88 Y old Female, DOB: 1945/08/23    [address], [place], [place] [address]    Home: [phone]    Provider: Cheryle Horsfall        * * *    Telephone Encounter    ---    Answered by    Cyndra Numbers    Date: 12/27/2016        Time: 11:03 AM    Caller    pt    ------            Reason    Refills            Action Taken    Tyeisha Dinan G 12/27/2016 2:02:01 PM > , erefill done            Refills    Refill omeprazole delayed release capsule, 20 mg, orally, 90, 1  cap(s), once a day, Refills=2    ------          * * *                ---          * * *          Patient: [name], [name] DOB: [birth year] Provider: Cheryle Horsfall  12/27/2016    ---    Note generated by eClinicalWorks EMR/PM Software ([email or web address])

## 2017-03-31 ENCOUNTER — Ambulatory Visit (HOSPITAL_BASED_OUTPATIENT_CLINIC_OR_DEPARTMENT_OTHER): Admitting: Family Medicine

## 2017-03-31 NOTE — Progress Notes (Signed)
* * *        **Hannah [name]**    ------    18 Y old Female, DOB: September 11, 1945    [address], [place], [place] [address]    Home: [phone]    Provider: Cheryle Horsfall        * * *    Telephone Encounter    ---    Answered by    Cyndra Numbers    Date: 03/31/2017        Time: 11:23 AM    Caller    pt    ------            Reason    Refills            Action Taken    Hannah [name] 03/31/2017 1:36:55 PM > , erefill done            Refills    Refill Zomig tablet, 5 mg, orally, 18, 1 tab(s), once [name] day prn  migraine, Refills=1    ------          * * *              * * *        ---        Reason for Appointment    ---      1\. Refills    ---      Current Medications    ---    Unknown      * omeprazole 20 mg delayed release capsule 1 cap(s) orally once [name] day     ---    * Medrol dose pack as per package as directed oral as per pack     ---    * omeprazole 20 mg delayed release capsule 1 orally qd     ---    * paroxetine 10 mg tablet 1 orally qd     ---    * clobetasol topical 0.05% ointment applied topically bid to affected area x 2 weeks and prn     ---    * risperidone 0.25 mg tablet 1 tab(s) orally qd     ---    * calcium carbonate 600 mg tablet 2 tab(s) orally daily     ---    * Vitamin B12 500 mcg tablet 1 tab(s) orally once [name] day     ---    * Vitamin D3 2000 intl units tablet 1 tab(s) orally once [name] day     ---    * Zomig 5 mg tablet 1 tab(s) orally once [name] day     ---    * lamotrigine 100 mg tablet 1 tab(s) orally qd     ---    * lamotrigine 25 mg tablet 1 tab(s) orally qd     ---    * atorvastatin 20 mg tablet 1 tab(s) orally once [name] day     ---    * duloxetine 30 mg delayed release capsule orally     ---      Past Medical History    ---      Transient global amnesia    ---    acoustic Neuroma    ---    Osteoporosis    ---    chronic GERD    ---    Migraines    ---    Lichen sclerosis    ---    Left breast ductal hyperplasia    ---  Tinnitus    ---    Diverticulosis- 9 inches of  colon removed    ---    colonoscopy: 07/14/11 with Dr.Marinelli, repeat in 5 years. mother had colon  cancer    ---    Plantar fasciitis    ---      Gyn History    ---    Periods : Hyst 2010.    Sexual activity currently sexually active.    Last pap smear date 12/2013.    Last mammogram date 07/20/16 normal, 06/2015 normal, 03/2013.    Abnormal pap smear none.    Date of Last Period 1996.    STD none.    HPV no.    HSV no.    DES exposure no.    HIV no.    has own gyn was pt of jch.    Other bone density 2013 osteoporosis.      OB History    ---    Total pregnancies 3\.    Total living children 3\.    Gravida 3\.    Para 3\.    Miscarriage(s) 0\.    Pregnancy # 1: NSVD.    Pregnancy # 2: NSVD.    Pregnancy # 3 NSVD.      Allergies    ---      codeine    ---    sulfamethizole    ---      Treatment    ---      **1\. Others**    Refill Zomig tablet, 5 mg, 1 tab(s), orally, once [name] day prn migraine, 18,  Refills 1    ---          * * *          Patient: Hannah [name] DOB: 1945-10-18 Provider: Cheryle Horsfall  03/31/2017    ---    Note generated by eClinicalWorks EMR/PM Software ([email or web address])

## 2017-04-12 ENCOUNTER — Ambulatory Visit

## 2017-07-11 ENCOUNTER — Ambulatory Visit (HOSPITAL_BASED_OUTPATIENT_CLINIC_OR_DEPARTMENT_OTHER): Admitting: Family Medicine

## 2017-07-11 NOTE — Progress Notes (Signed)
* * *        **[name], [name]**    ------    45 Y old Female, DOB: [birth year]    [address], [place], [place] [address]    Home: [phone]    Provider: Cheryle Horsfall        * * *    Telephone Encounter    ---    Answered by    Rubbie Battiest    Date: 07/11/2017        Time: 10:37 AM    Caller    self    ------            Reason    bone scan            Message                      Pt left VM asking if she can have a bone scan at [facility] between now and 08/01/17.      tel. [phone] or [phone]                Action Taken    Prue Lingenfelter G 07/11/2017 2:00:52 PM > I assume she means  bone density test. order generated. tx PH Sorn,Sokha 07/12/2017 11:23:28 AM >  Booked for 07/29/17 8:30am @ [facility] left detailed v/m on pt's home __#. manually  faxed order to [facility]. mailed reminder to pt                * * *              * * *        ---        Reason for Appointment    ---      1\. Bone scan    ---      Current Medications    ---      None    ---      Past Medical History    ---      Transient global amnesia    ---    acoustic Neuroma    ---    Osteoporosis    ---    chronic GERD    ---    Migraines    ---    Lichen sclerosis    ---    Left breast ductal hyperplasia    ---    Tinnitus    ---    Diverticulosis- 9 inches of colon removed    ---    colonoscopy: 07/14/11 with Dr.Marinelli, repeat in 5 years. mother had colon  cancer    ---    Plantar fasciitis    ---      Gyn History    ---    Periods : Hyst 2010.    Sexual activity currently sexually active.    Last pap smear date 12/2013.    Last mammogram date 07/20/16 normal, 06/2015 normal, 03/2013.    Abnormal pap smear none.    Date of Last Period 1996.    STD none.    HPV no.    HSV no.    DES exposure no.    HIV no.    has own gyn was pt of jch.    Other bone density 2013 osteoporosis.      OB History    ---    Total pregnancies 3\.    Total living children 3\.    Gravida 3\.  Para 3\.    Miscarriage(s) 0\.    Pregnancy # 1:  NSVD.    Pregnancy # 2: NSVD.    Pregnancy # 3 NSVD.      Allergies    ---      codeine    ---    sulfamethizole    ---      Assessments    ---    1\. Osteopenia, unspecified location - M85.80 (Primary)    ---      Treatment    ---      **1\. Osteopenia, unspecified location**    BD Bone Density DEXA Axial Skeleton [id number] Sorn,Sokha 07/12/2017 11:23:28  AM > Booked for 07/29/17 8:30am @ [facility]. left detailed v/m on pt's home _#.  manually faxed order to [facility]. mailed reminder to pt    ---          * * *          Patient: [name], [name] DOB: [birth year] Provider: Cheryle Horsfall  07/11/2017    ---    Note generated by eClinicalWorks EMR/PM Software ([email or web address])

## 2017-07-20 ENCOUNTER — Ambulatory Visit (HOSPITAL_BASED_OUTPATIENT_CLINIC_OR_DEPARTMENT_OTHER): Admitting: Family Medicine

## 2017-07-20 ENCOUNTER — Ambulatory Visit: Admitting: Family Medicine

## 2017-07-20 NOTE — Progress Notes (Signed)
* * *        **  Sebastiani, Raela A**    ------    46 Y old Female, DOB: 1945-12-04    [address], [place], [place] [address]    Home: [phone]    Provider: Cheryle Horsfall        * * *    Telephone Encounter    ---    Answered by    Cheryle Horsfall    Date: 07/20/2017        Time: 06:49 PM    Reason    results    ------            Action Taken    Kerin Cecchi MD 07/20/2017 6:49:21 PM > mammogram: no  evidence of malignancy dr Rexene Edison                * * *        **eMessages**   From:    Electa Sniff    ------    Created:    2017-07-20 14:07:33.0    Sent:      Subject:    [id number]    Message:                      Electa Sniff MD  07/20/2017 6:49:21 PM >  mammogram:  no evidence of malignancy  dr Rexene Edison                        ---          * * *          Patient: Deandrade, Brita A DOB: July 10, 1946 Provider: Cheryle Horsfall  07/20/2017    ---    Note generated by eClinicalWorks EMR/PM Software ([email or web address])

## 2017-07-29 ENCOUNTER — Ambulatory Visit: Admitting: Family Medicine

## 2017-08-05 ENCOUNTER — Ambulatory Visit (HOSPITAL_BASED_OUTPATIENT_CLINIC_OR_DEPARTMENT_OTHER): Admitting: Family Medicine

## 2017-08-05 NOTE — Progress Notes (Signed)
* * *        **  Meckler, Opal A**    ------    75 Y old Female, DOB: Jun 30, 1946    [address], [place], [place] [address]    Home: [phone]    Provider: Cheryle Horsfall        * * *    Telephone Encounter    ---    Answered by    Cheryle Horsfall    Date: 08/05/2017        Time: 01:01 PM    Reason    results    ------            Message                      bone density  osteopenia, but improved.                Action Taken    Dari Carpenito G 08/05/2017 1:01:35 PM > reported.                * * *                ---          * * *          Patient: Jenson, Lexani A DOB: [birth year] Provider: Cheryle Horsfall  08/05/2017    ---    Note generated by eClinicalWorks EMR/PM Software ([email or web address])

## 2017-09-06 ENCOUNTER — Ambulatory Visit: Admitting: Family Medicine

## 2018-01-25 ENCOUNTER — Ambulatory Visit

## 2018-02-02 ENCOUNTER — Ambulatory Visit (HOSPITAL_BASED_OUTPATIENT_CLINIC_OR_DEPARTMENT_OTHER): Admitting: Family Medicine

## 2018-04-02 ENCOUNTER — Ambulatory Visit (HOSPITAL_BASED_OUTPATIENT_CLINIC_OR_DEPARTMENT_OTHER): Admitting: Family Medicine

## 2018-04-03 ENCOUNTER — Ambulatory Visit

## 2018-04-03 ENCOUNTER — Ambulatory Visit (HOSPITAL_BASED_OUTPATIENT_CLINIC_OR_DEPARTMENT_OTHER): Admitting: Family Medicine

## 2018-04-03 NOTE — Progress Notes (Signed)
* * *        **  [name], [name]**    ------    69 Y old Female, DOB: [birth year]    [address], [place], [place] [address]    Home: [phone]    Provider: Cheryle Horsfall        * * *    Telephone Encounter    ---    Answered by    Wilfrid Lund    Date: 04/03/2018        Time: 08:50 AM    Caller    fax - [facility]    ------            Reason    refill            Message                      requesting refill of atorvastatin 20mg  to [facility] in [place], [place]. Last CPE 02/05/16, no FU appts, no pending appts                Action Taken                      Verline Kong G 04/03/2018 11:05:17 AM >  erx done                Refills    Refill atorvastatin tablet, 20MG , orally, 90, 1 tab(s), once a  day, Refills=3    ------          * * *                ---          * * *          Patient: [name], [name] DOB: [birth year] Provider: Cheryle Horsfall  04/03/2018    ---    Note generated by eClinicalWorks EMR/PM Software ([email or web address])

## 2018-07-07 ENCOUNTER — Ambulatory Visit (HOSPITAL_BASED_OUTPATIENT_CLINIC_OR_DEPARTMENT_OTHER): Admitting: Family Medicine

## 2018-07-07 NOTE — Progress Notes (Signed)
* * *        **  [name], [name]**    ------    73 Y old Female, DOB: 09-07-1945    [address], [place], [place] [address]    Home: [phone]    Provider: Cheryle Horsfall        * * *    Telephone Encounter    ---    Answered by    Wilfrid Lund    Date: 07/07/2018        Time: 03:50 PM    Caller    pt calling    ------            Reason    gagging            Message                      states she has been weaning off medications, has gotten off of risperidone and cymbalta.  Finds herself gagging frequently, wondering if this could be related to stopping those medications.  Is aware you are not in today, would like a call back on Monday.      callback # [phone]                Action Taken                      Cheryle Horsfall 07/10/2018 6:11:39 PM >  called. now on [employer].   better now                    * * *                ---          * * *          Patient: [name], [name] DOB: [birth year] Provider: Cheryle Horsfall  07/07/2018    ---    Note generated by eClinicalWorks EMR/PM Software ([email or web address])

## 2018-07-21 ENCOUNTER — Ambulatory Visit: Admitting: Family Medicine

## 2018-07-21 ENCOUNTER — Ambulatory Visit (HOSPITAL_BASED_OUTPATIENT_CLINIC_OR_DEPARTMENT_OTHER): Admitting: Family Medicine

## 2018-07-24 ENCOUNTER — Ambulatory Visit (HOSPITAL_BASED_OUTPATIENT_CLINIC_OR_DEPARTMENT_OTHER): Admitting: Family Medicine

## 2018-07-24 NOTE — Progress Notes (Signed)
* * *        **  Hannah [name], Hannah [name]**    ------    59 Y old Female, DOB: April 22, 1946    [address], [place], [place] [address]    Home: [phone]    Provider: Cheryle Horsfall        * * *    Telephone Encounter    ---    Answered by    Maple Hudson LPN, Paulette    Date: 07/24/2018        Time: 12:53 PM    Caller    pt    ------            Reason    Refills            Action Taken                      Chaquita Basques G 07/24/2018 1:36:57 PM >  does she want this filled in [place]?   I thought they spent winters in [place], LPN ,[facility]  08/65/7846 3:44:52 PM > yes she is in [facility]      Dorlene Footman G 07/24/2018 5:56:38 PM >  erx done                Refills    Start atorvastatin tablet, 20 mg, orally, 90, 1 tab(s), once [name]  day, Refills=3    ------          * * *                ---          * * *          Patient: [name], [name] DOB: 05-Mar-1946 Provider: Cheryle Horsfall  07/24/2018    ---    Note generated by eClinicalWorks EMR/PM Software ([email or web address])

## 2019-01-17 ENCOUNTER — Ambulatory Visit: Admitting: Family Medicine

## 2019-01-17 ENCOUNTER — Ambulatory Visit (HOSPITAL_BASED_OUTPATIENT_CLINIC_OR_DEPARTMENT_OTHER): Admitting: Family Medicine

## 2019-01-17 LAB — HX CBC W/ INDICES
CASE NUMBER: 2020176002811
HX ABSOLUTE NRBC COUNT: 0 10*3/uL
HX HCT: 47.8 % — ABNORMAL HIGH (ref 36.0–47.0)
HX HGB: 14.8 g/dL — NL (ref 11.8–16.0)
HX MCH: 28.1 pg — NL (ref 26.0–34.0)
HX MCHC: 31 g/dL — NL (ref 31.0–37.0)
HX MCV: 90.7 fL — NL (ref 80.0–100.0)
HX MPV: 9.7 fL — NL (ref 9.4–12.4)
HX NRBC PERCENT: 0 % — NL
HX PLATELET: 307 10*3/uL — NL (ref 150.0–400.0)
HX RBC: 5.27 10*6/uL — ABNORMAL HIGH (ref 3.9–5.2)
HX RDW-CV: 13.2 % — NL (ref 11.5–14.5)
HX RDW-SD: 44 fL — NL (ref 35.0–51.0)
HX WBC: 6.9 10*3/uL — NL (ref 3.7–11.2)

## 2019-01-17 LAB — HX SEDIMENTATION RATE
CASE NUMBER: 2020176002811
HX SED RATE: 11 mm/h — NL (ref 0.0–30.0)

## 2019-01-17 LAB — HX URIC ACID
CASE NUMBER: 2020176002811
HX URIC ACID: 5.8 mg/dL — NL (ref 2.6–7.0)

## 2019-01-17 LAB — HX RHEUMATOID FACTOR, QUANTITATIVE
CASE NUMBER: 2020176002811
HX RHEUMATOID FACTOR, QUANTITATIVE: <10 — NL (ref 0.0–15.0)

## 2019-01-17 NOTE — Progress Notes (Signed)
** Progress Note  **    ---    **Patient:** Hannah [name], Hannah [name]    **Account Number:** [id number]    **Provider:** Electa Sniff, M.D    **DOB:** [birth year]  **Age:** 24 Y  **Sex:** Female    **Date:** 01/17/2019    **Phone:** [phone]    **Address:** [address], [place], [phone]        * * *        **Subjective:**        ---      **Chief Complaints:**    ------      1\. lot of pain all over/joint pain/swelling per pt, wants to rule out Lyme  disease per pt, denies COVID sx, aware of COVID policy. 2. started about 2-3  months feels swollen She has been to [place].    ------      **HPI:**    _Follow-up_ :    3 months of joint pains. neck OK. shoulders OK. left elbow tight. fingers  painful and swollen. low back pain. both hips knees. ankles. no hx tick bite.  no new meds.    ------      **ROS:**    _CONSTITUTIONAL_ :    no  Fever.  no  Chills.    _ENT_ :    no  Rhinorrhea. Ringing in Ears  yes  . Hearing Loss  yes  .    _RESPIRATORY_ :    no  Shortness of Breath.  no  Persistent Cough.  no  Wheezing.    _CARDIOLOGY_ :    c/o  Chest Pain.  no  Palpitations.  no  Dizziness.  no  Leg Edema.    _GASTROENTEROLOGY_ :    c/o  Heartburn.    _UROLOGY_ :    no  Dysuria.    _MUSCULOSKELETAL_ :    c/o  Back Pain.    _NEUROLOGY_ :    Positive for  left eyelid drop  .  no  Tingling/Numbness.    _PSYCHOLOGY_ :    c/o  Depression.  c/o  Anxiety.    _DERMATOLOGY_ :    Positive for  squamous cell cancer of leg.  .        ------      **Medical History:** Transient global amnesia, acoustic Neuroma,  Osteoporosis, chronic GERD, Migraines, Lichen sclerosis, Left breast ductal  hyperplasia, Tinnitus, Diverticulosis- 9 inches of colon removed, colonoscopy:  07/14/11 with Dr.Marinelli, repeat in 5 years. mother had colon cancer,  Plantar fasciitis, Breast Feeding: yes.        ------      **Family History:**    colon cancer in mother. father healthy.    ------      **Social History:** Tobacco Use  Status: never smoker   , Patient counseled  on the dangers of tobacco use: 02/05/2016. no Alcohol. Children: 3 girls.  Caffeine: decaf. no Drug use. Occupation: retired. Exercise: yes, walking,  swimming off and on.    ------      **Medications:** Taking Zomig 5 mg tablet 1 tab(s) orally once [name] day prn  migraine, Taking omeprazole 20 mg delayed release capsule 1 cap(s) orally once  [name] day, Taking atorvastatin 20MG  tablet 1 tab(s) orally once [name] day, Taking  atorvastatin 20 mg tablet 1 tab(s) orally once [name] day, Taking Trintellix ,  Taking risperidone 0.25 mg tablet 1 tab(s) orally qd, Taking lamotrigine 100  mg tablet 1 tab(s) orally qd, Discontinued Medrol dose pack as per package as  directed oral  as per pack, Discontinued paroxetine 10 mg tablet 1 orally qd,  Discontinued clobetasol topical 0.05% ointment applied topically bid to  affected area x 2 weeks and prn, Discontinued duloxetine 30 mg delayed release  capsule orally , Unknown calcium carbonate 600 mg tablet 2 tab(s) orally  daily, Unknown Vitamin B12 500 mcg tablet 1 tab(s) orally once [name] day, Unknown  Vitamin D3 2000 intl units tablet 1 tab(s) orally once [name] day, Unknown  lamotrigine 25 mg tablet 1 tab(s) orally qd, Medication List reviewed and  reconciled with the patient    ------      **Allergies:** Codeine, Sulfamethizole.    ------        **Objective:**        ---      **Vitals:** BP **132/80** , Temp **98.2** , Ht 59.5, Wt **187** , Wt Change  5 lb, BMI  **37.13** .    ------      **Examination:    ** _General Examination:_    General  alert and oriented  ,  well nourished and hydrated  ,  appropriate  attire and affect  .    HEENT:  left eyelid droop, complete hearing loss left ear  .    Oral cavity:  clear  ,  moist mucus membranes  .    Heart:  RRR  ,  no murmurs  .    Lungs:  clear to auscultation bilaterally  ,  no wheezes/rhonchi/rales  .    Abdomen:  soft, NT/ND, BS present  ,  no masses palpated  ,  no  hepatosplenomegaly  .    Back:  normal ROM of spine  .     Female Genitourinary:  per gyn  .    Extremities  no clubbing, no edema  .    Peripheral pulses:  normal (2+) bilaterally  .    Skin:  moist, warm  .    Neurologic Exam:  balance disturbance. left hearing loss and left eyelid droop  .        ------            **Assessment:**        ---      **Assessment:**        1\. Polyarthralgia - M25.50 (Primary)    2\. Hyperlipidemia - E78.5    3\. Osteopenia determined by x-ray - M85.80    4\. Depression with anxiety - F41.8    ------        **Plan:**        ---        **1\. Polyarthralgia**    _LAB: Antinuclear Antibody Screen_   ANA    Positive    [name]    <1:40 -    ------------    _LAB: Lyme Antibodies_ Lyme Abs Tot    <0.90      <0.90 - Index    ------------    Progess Lyme    Not indicated      \-    ------------    _LAB: CBC w/ Indices_ Normal  WBC    6.9      3.7-11.2 - thous/mm3    ------------    RBC    5.27    H    3.90-5.20 - Mil/mm3    ------------    Hgb    14.8      11.8-16.0 - Gm/dL    ------------    Hct    47.8    H  36.0-47.0 - %    ------------    MCV    90.7      80.0-100.0 - fL    ------------    MCH    28.1      26.0-34.0 - pGm    ------------    MCHC    31.0      31.0-37.0 - Gm/dL    ------------    Platelet    307      150-400 - thous/mm3    ------------    RDW-SD    44.0      35.0-51.0 - fL    ------------    MPV    9.7      9.4-12.4 - fL    ------------    _LAB: Rheumatoid Factor, Quantitative_ Normal  RA, Quant    <10.0      0.0-15.0 - IU/mL    ------------    _LAB: Sedimentation Rate_ Normal  Sed Rate    11      0-30 - mm/hr    ------------    _LAB: Uric Acid_ Normal  Uric Acid    5.8      2.6-7.0 - mg/dL    ------------        Notes: ddx is OA or immune arthritis. check labs. stop statin for 1 month to  see if that is contributing.    **2\. Hyperlipidemia**    Notes: stop statin for [name] month to see if contributing to myalgias and  arthragias.     **3\. Osteopenia determined by x-ray**    Notes:    calcium, vitamin D, exercise.    .    **4\. Depression with anxiety**    Notes: can stop risperdol if dry mouth is too problematic.      **Follow Up:** F/U any worsening or persistent symptoms.    ------    ---    ---          **Provider:** Electa Sniff, M.D    ---    **Patient:** Hannah [name], Hannah [name] **DOB:** 11-08-1945 **Date:** 01/17/2019    ---    Electronically signed by Electa Sniff , MD on 01/21/2019 at 11:18 AM EDT    Sign off status: Completed

## 2019-01-18 ENCOUNTER — Ambulatory Visit (HOSPITAL_BASED_OUTPATIENT_CLINIC_OR_DEPARTMENT_OTHER): Admitting: Family Medicine

## 2019-01-18 NOTE — Progress Notes (Signed)
* * *        **  Hannah [name], Hannah [name]**    ------    39 Y old Female, DOB: 1946-02-19    [address], [place], [place] [address]    Home: [phone]    Provider: Cheryle Horsfall        * * *    Telephone Encounter    ---    Answered by    Cheryle Horsfall    Date: 01/18/2019        Time: 06:29 PM    Reason    results    ------            Message                      labs normal so far.                Action Taken                      Lacey Wallman G 01/18/2019 6:29:59 PM > reported.                    * * *                ---          * * *          Patient: [name], Hannah [name] DOB: 01-11-1946 Provider: Cheryle Horsfall  01/18/2019    ---    Note generated by eClinicalWorks EMR/PM Software ([email or web address])

## 2019-01-19 LAB — HX .ANTINUCLEAR ANTIBODIES TITER
CASE NUMBER: 2020176002811
HX ANA TITER: 1:320 {titer} — AB

## 2019-01-19 LAB — HX ANTINUCLEAR ANTIBODY SCREEN
CASE NUMBER: 2020176002811
HX ANA: POSITIVE — AB

## 2019-01-20 LAB — HX LYME ANTIBODIES W/ RFLX IB
CASE NUMBER: 2020176002811
HX LYME ABS TOT: <0.9

## 2019-01-26 ENCOUNTER — Ambulatory Visit (HOSPITAL_BASED_OUTPATIENT_CLINIC_OR_DEPARTMENT_OTHER): Admitting: Family Medicine

## 2019-01-26 NOTE — Progress Notes (Signed)
* * *        **  [name], [name]**    ------    60 Y old Female, DOB: [birth year]    [address], [place], [place] [address]    Home: [phone]    Provider: Cheryle Horsfall        * * *    Telephone Encounter    ---    Answered by    Cheryle Horsfall    Date: 01/26/2019        Time: 03:08 PM    Reason    results    ------            Message                      labs:   neg except pos ANA,  but sed rate normal.                 Action Taken                      Emerly Prak G 01/26/2019 3:08:33 PM >  will try NSAIDs,  exercise.                      * * *                ---          * * *          Patient: [name], [name] DOB: 09-Apr-1946 Provider: Cheryle Horsfall  01/26/2019    ---    Note generated by eClinicalWorks EMR/PM Software ([email or web address])

## 2019-02-08 ENCOUNTER — Ambulatory Visit (HOSPITAL_BASED_OUTPATIENT_CLINIC_OR_DEPARTMENT_OTHER): Admitting: Family Medicine

## 2019-02-08 NOTE — Progress Notes (Signed)
** Progress Notes  **    ---    **Patient:** Hannah [name], Hannah [name]    **Account Number:** [id number]    **Provider:** Electa Sniff, M.D    **DOB:** 1946/02/15  **Age:** 57 Y  **Sex:** Female    **Date:** 02/08/2019    **Phone:** [phone]    **Address:** [address], [place], [id number]        * * *        **Subjective:**        ---      **Chief Complaints:**    ------      1\. cataract surgery left eye. Dr. Pennie Rushing. surgery on 02-26-19. Pt aware of  Covid 19 policy. 2. Feeling fine.    ------      **HPI:**    _Interim History_ :    Here for preop right cataract surgery later this month. hx of left acoustic  neuroma, chronic GERD, depression/anxiety, lichen sclerosis, hyperlipidemia.  no recent hospital stays. back on statin. stopping it did not help.    ------      **ROS:**    _CONSTITUTIONAL_ :    no  Fever.  no  Chills.    _ENT_ :    no  Rhinorrhea. Ringing in Ears  yes  . Hearing Loss  yes  .    _RESPIRATORY_ :    no  Shortness of Breath.  no  Persistent Cough.  no  Wheezing.    _CARDIOLOGY_ :    no  Chest Pain.  no  Palpitations.  no  Dizziness.  no  Leg Edema.    _GASTROENTEROLOGY_ :    c/o  Heartburn.    _UROLOGY_ :    no  Dysuria.    _MUSCULOSKELETAL_ :    c/o  Back Pain.    _NEUROLOGY_ :    Positive for  left eyelid drop  .  no  Tingling/Numbness.    _PSYCHOLOGY_ :    c/o  Depression.  c/o  Anxiety.    _DERMATOLOGY_ :    Positive for  squamous cell cancer of leg.  .        ------      **Medical History:** Transient global amnesia, acoustic Neuroma,  Osteoporosis, chronic GERD, Migraines, Lichen sclerosis, Left breast ductal  hyperplasia, Tinnitus, Diverticulosis- 9 inches of colon removed, colonoscopy:  07/14/11 with Dr.Marinelli, repeat in 5 years. mother had colon cancer,  Plantar fasciitis, Depression with anxiety, Hyperlipidemia.        ------      **Gyn History:** Periods : Hyst 2010. Sexual activity currently sexually  active. Last pap smear date 12/2013. Abnormal pap smear none. Date of  Last  Period 1996. STD none. HPV no. HSV no. DES exposure no. HIV no. has own gyn  was pt of jch. Other bone density 2013 osteoporosis.    ------      **OB History:** Total pregnancies 3\. Total living children 3\. Gravida 3\.  Para 3\. Miscarriage(s) 0\. Pregnancy # 1: NSVD. Pregnancy # 2: NSVD.  Pregnancy # 3 NSVD.    ------      **Surgical History:** tonsillectomy/adenoidectomy (as a child) ,  appendectomy 1971, rectal fissurectomies 1973, diagnostic lap (corpus luteum)  1981, Vag hyst BSO post colporrhaphy with culdoplasty (pelvic relaxation)  1999, acoustic neuroma .    ------      **Hospitalization/Major Diagnostic Procedure:** # 3 vaginal deliveried ,  See Surgical hx .    ------      **Family History:**    colon cancer  in mother. father healthy.    ------      **Social History:** no Tobacco Use . no Alcohol. Marital Status: married.  no Drug use. no Exercise.    ------      **Medications:** Taking Zomig 5 mg tablet 1 tab(s) orally once a day prn  migraine, Taking omeprazole 20 mg delayed release capsule 1 cap(s) orally once  a day, Taking atorvastatin 20 mg tablet 1 tab(s) orally once a day, Taking  Trintellix , Taking risperidone 0.25 mg tablet 1 tab(s) orally qd, Taking  lamotrigine 100 mg tablet 1 tab(s) orally qd, Taking calcium carbonate 600 mg  tablet 2 tab(s) orally daily, Taking Vitamin B12 500 mcg tablet 1 tab(s)  orally once a day, Taking Vitamin D3 2000 intl units tablet 1 tab(s) orally  once a day, Taking lamotrigine 25 mg tablet 1 tab(s) orally qd, Discontinued  atorvastatin 20MG  tablet 1 tab(s) orally once a day, Medication List reviewed  and reconciled with the patient    ------      **Allergies:** Codeine, Sulfamethizole.    ------        **Objective:**        ---      **Vitals:** BP **132/82** , Temp **97.4** , Ht 59.5, Wt **183** , Wt Change  -4 lb, BMI  **36.34** .    ------      **Examination:    ** _General Examination:_    General  alert and oriented  ,  well nourished  and hydrated  ,  appropriate  attire and affect  .    HEENT:  left eyelid droop, complete hearing loss left ear  .    Oral cavity:  clear  ,  moist mucus membranes  .    Heart:  RRR  ,  no murmurs  .    Lungs:  clear to auscultation bilaterally  ,  no wheezes/rhonchi/rales  .    Abdomen:  soft, NT/ND, BS present  ,  no masses palpated  ,  no  hepatosplenomegaly  .    Back:  normal ROM of spine  .    Female Genitourinary:  per gyn  .    Extremities  no clubbing, no edema  .    Peripheral pulses:  normal (2+) bilaterally  .    Skin:  moist, warm  .    Neurologic Exam:  balance disturbance. left hearing loss and left eyelid droop  .        ------            **Assessment:**        ---      **Assessment:**        1\. Cataract - H26.9 (Primary)    2\. Hyperlipidemia - E78.5    3\. 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 Family hx of colon cancer - Z80.0    5\. Osteopenia determined by x-ray - M85.80    6\. Chronic GERD - K21.9    7\. Depression with anxiety - F41.8    8\. Vitamin D deficiency - E55.9    9\. Lichen sclerosus - L90.0    ------        **Plan:**        ---        **1\. Cataract**    Notes: OK for surgery. no CAD, no DM, no COPD. no active infections, no  bleeding problems. EKG today.    ---    **2\. Hyperlipidemia**    _LAB:  Lipid Panel_    _Imaging: EKG_    Notes:  lipids fine when last checked. recheck. EKG normal.  .    **3\. Acoustic neuroma**    Notes:  left sided. had surgery in past. residual left ear deafness, balance  disturbance, left eyelid droop.  .    **4\. Family hx of colon cancer**    Notes:  colonoscopy up to date  .    **5\. Osteopenia determined by x-ray**    _LAB: CBC w/ Indices_    Notes:  calcium, exercise, vitamin d.  .    **6\. Chronic GERD**    _LAB: Basic Metabolic Panel_    Notes:  on PPI long term, unable to stop  .    **7\. Depression with anxiety**    Notes:  lamictal, prn respirdol  .    **8\. Vitamin D deficiency**    Notes:  on supplements.  .    **9\. Lichen sclerosus**     Notes:  per gyn  .      **Procedure Codes:** 93000 ECG W/ INTERP    ------      **Follow Up:** 3 Months, prn    ------    ---    ---          **Provider:** Electa Sniff, M.D    ---    **Patient:** Cowden, Keshauna A **DOB:** [birth year] **Date:** 02/08/2019    ---    Electronically signed by Electa Sniff , MD on 02/09/2019 at 08:53 AM EDT    Sign off status: Completed

## 2019-02-09 ENCOUNTER — Ambulatory Visit: Admitting: Family Medicine

## 2019-02-09 ENCOUNTER — Ambulatory Visit (HOSPITAL_BASED_OUTPATIENT_CLINIC_OR_DEPARTMENT_OTHER): Admitting: Family Medicine

## 2019-02-09 LAB — HX LIPID PANEL
CASE NUMBER: 2020199001803
HX CHOL: 247 mg/dL — ABNORMAL HIGH
HX HDL: 69 mg/dL — NL
HX LDL: 152 mg/dL — ABNORMAL HIGH
HX TRIG: 129 mg/dL — NL

## 2019-02-09 LAB — HX BASIC METABOLIC PANEL
CASE NUMBER: 2020199001803
HX ANION GAP: 7 — NL (ref 3.0–11.0)
HX BUN: 11 mg/dL — NL (ref 8.0–23.0)
HX CALCIUM LVL: 9.5 mg/dL — NL (ref 8.5–10.5)
HX CHLORIDE: 106 mmol/L — NL (ref 98.0–110.0)
HX CO2: 29 mmol/L — NL (ref 21.0–32.0)
HX CREATININE: 0.936 mg/dL — NL (ref 0.55–1.3)
HX GLUCOSE LVL: 82 mg/dL — NL (ref 70.0–110.0)
HX POTASSIUM LVL: 4.5 mmol/L — NL (ref 3.6–5.2)
HX SODIUM LVL: 142 mmol/L — NL (ref 136.0–146.0)

## 2019-02-09 LAB — HX CBC W/ INDICES
CASE NUMBER: 2020199001803
HX ABSOLUTE NRBC COUNT: 0 10*3/uL
HX HCT: 50.1 % — ABNORMAL HIGH (ref 36.0–47.0)
HX HGB: 15.8 g/dL — NL (ref 11.8–16.0)
HX MCH: 28.6 pg — NL (ref 26.0–34.0)
HX MCHC: 31.5 g/dL — NL (ref 31.0–37.0)
HX MCV: 90.6 fL — NL (ref 80.0–100.0)
HX MPV: 9.8 fL — NL (ref 9.4–12.4)
HX NRBC PERCENT: 0 % — NL
HX PLATELET: 314 10*3/uL — NL (ref 150.0–400.0)
HX RBC: 5.53 10*6/uL — ABNORMAL HIGH (ref 3.9–5.2)
HX RDW-CV: 12.6 % — NL (ref 11.5–14.5)
HX RDW-SD: 42.1 fL — NL (ref 35.0–51.0)
HX WBC: 6.5 10*3/uL — NL (ref 3.7–11.2)

## 2019-02-09 LAB — HX GLOMERULAR FILTRATION RATE (ESTIMATED)
CASE NUMBER: 2020199001803
HX AFN AMER GLOMERULAR FILTRATION RATE: 71 mL/min/{1.73_m2}
HX NON-AFN AMER GLOMERULAR FILTRATION RATE: 62 mL/min/{1.73_m2}

## 2019-02-10 ENCOUNTER — Ambulatory Visit (HOSPITAL_BASED_OUTPATIENT_CLINIC_OR_DEPARTMENT_OTHER): Admitting: Family Medicine

## 2019-02-10 NOTE — Progress Notes (Signed)
* * *        **  [name], [name]**    ------    67 Y old Female, DOB: [birth year]    [address], [place], [place] [address]    Home: [phone]    Provider: Cheryle Horsfall        * * *    Telephone Encounter    ---    Answered by    Cheryle Horsfall    Date: 02/10/2019        Time: 02:31 PM    Reason    results    ------            Message                      labs normal except lipids high                Action Taken                      Karly Pitter G 02/10/2019 2:31:52 PM > recheck when on statin continuously for 2 months.                    * * *                ---          * * *          Patient: [name], [name] DOB: [birth year] Provider: Cheryle Horsfall  02/10/2019    ---    Note generated by eClinicalWorks EMR/PM Software ([email or web address])

## 2019-02-12 ENCOUNTER — Ambulatory Visit (HOSPITAL_BASED_OUTPATIENT_CLINIC_OR_DEPARTMENT_OTHER): Admitting: Family Medicine

## 2019-02-12 NOTE — Progress Notes (Signed)
* * *        **  Hannah [name], Hannah [name]**    ------    24 Y old Female, DOB: 06-17-1946    [address], [place], [place] [address]    Home: [phone]    Provider: Cheryle Horsfall        * * *    Telephone Encounter    ---    Answered by    Wilfrid Lund    Date: 02/12/2019        Time: 09:36 AM    Caller    pt calling    ------            Reason    question            Message                      states she has an appt at another office for an eye check today (is having caratact surgery 02/26/19).  States that for the past few days she has had an "odd sensation" under her tongue and inside both cheeks.  States she has been taking care of her daughter's dog for the past 4 days, wondering if she might be allergic to the dog, also wondering if this could be [name] COVID symptom, wondering if she should keep her appt this morning with the other doctor (has to leave in about 10  minutes).  Advised that it could be an allergy, that if she was concerned about COVID that she should call the other doctor's office and let them know what was going on, to see if they wanted to reschedule.  I let her know that I would put her message in to Dr. Gwendel Hanson.                Action Taken                      Wilfrid Lund  02/12/2019 2:13:54 PM > pt calling back, would like to get tested for COVID at [facility] in [place].  Callback # [phone]      Cheryle Horsfall 02/12/2019 7:08:26 PM > sent order to [facility] testing site.   she can call # to make appointment for testing.                       * * *              * * *        ---        Reason for Appointment    ---      1\. Question    ---      Assessments    ---    1\. Exposure to COVID-19 virus - Z20.828 (Primary)    ---      Treatment    ---      **1\. Exposure to COVID-19 virus**    _LAB: COVID19 ([place])_    ---          * * *          Patient: [name], [name] DOB: 1946/03/11 Provider: Cheryle Horsfall  02/12/2019    ---    Note generated by  eClinicalWorks EMR/PM Software ([email or web address])

## 2019-02-14 ENCOUNTER — Ambulatory Visit: Admitting: Family Medicine

## 2019-02-15 LAB — HX COVID19 (~~LOC~~)
CASE NUMBER: 2020204002064
HX COVID19 BY PCR (~~LOC~~): NEGATIVE

## 2019-02-16 ENCOUNTER — Ambulatory Visit (HOSPITAL_BASED_OUTPATIENT_CLINIC_OR_DEPARTMENT_OTHER): Admitting: Family Medicine

## 2019-02-16 NOTE — Progress Notes (Signed)
* * *        **  [name], [name]**    ------    68 Y old Female, DOB: [birth year]    [address], [place], [place] [address]    Home: [phone]    Provider: Cheryle Horsfall        * * *    Telephone Encounter    ---    Answered by    Cheryle Horsfall    Date: 02/16/2019        Time: 10:11 AM    Reason    results    ------            Message                      COVID test negative.                Action Taken                      Marigold Mom G 02/16/2019 10:11:34 AM >reported.                    * * *                ---          * * *          Patient: [name], [name] DOB: 27-May-1946 Provider: Cheryle Horsfall  02/16/2019    ---    Note generated by eClinicalWorks EMR/PM Software ([email or web address])

## 2019-03-14 ENCOUNTER — Ambulatory Visit (HOSPITAL_BASED_OUTPATIENT_CLINIC_OR_DEPARTMENT_OTHER): Admitting: Family Medicine

## 2019-03-14 NOTE — Progress Notes (Signed)
* * *        **  Hise, Rosela A**    ------    38 Y old Female, DOB: 02-17-1946    [address], [place], [place] [address]    Home: [phone]    Provider: Cheryle Horsfall        * * *    Telephone Encounter    ---    Answered by    Leonidas Romberg    Date: 03/14/2019        Time: 02:11 PM    Caller    Bosie Clos    ------            Reason    EKG needs faxed            Message                      Needs recent EKG faxed to her Cardiologist office in [facility], Paschal Dopp NP - [employer] Fax#[phone]. Patient states that NP will be in the office in the next hour to review, please send as soon as possible.                Action Taken                      Kolbey Teichert G 03/14/2019 7:11:46 PM >  printed. pls fax      Young, LPN ,Paulette  7/51/7001 8:37:57 AM > FAXED                    * * *                ---          * * *          Patient: Lozon, Darlen A DOB: [birth year] Provider: Cheryle Horsfall  03/14/2019    ---    Note generated by eClinicalWorks EMR/PM Software ([email or web address])

## 2019-03-29 ENCOUNTER — Ambulatory Visit

## 2019-04-09 ENCOUNTER — Ambulatory Visit: Admitting: Undersea and Hyperbaric Medicine

## 2019-04-09 ENCOUNTER — Ambulatory Visit (HOSPITAL_BASED_OUTPATIENT_CLINIC_OR_DEPARTMENT_OTHER): Admitting: Family Medicine

## 2019-04-09 ENCOUNTER — Ambulatory Visit

## 2019-04-09 NOTE — Progress Notes (Signed)
* * *        **  Gotschall, Wave A**    ------    62 Y old Female, DOB: 02/20/1946    [address], [place], [place] [address]    Home: [phone]    Provider: Cheryle Horsfall        * * *    Telephone Encounter    ---    Answered by    Wilfrid Lund    Date: 04/09/2019        Time: 10:04 AM    Caller    pt calling    ------            Reason    elevated BP            Message                      states she was started on Ritalin 2 weeks ago, was told that it could have an effect on her blood pressure.  States that yesterday morning her BP was 148/98, HR 86.  States that this morning after waking while sitting on edge of bed her BP was 157/104, HR 72, repeat was 156/107.  States she has no SOB but has had headaches in the morning.      callback # [phone]                Action Taken                      Cheryle Horsfall 04/09/2019 2:24:17 PM >  see other TE                    * * *                ---          * * *          Patient: Himmelberger, Shaquanda A DOB: Sep 01, 1945 Provider: Cheryle Horsfall  04/09/2019    ---    Note generated by eClinicalWorks EMR/PM Software ([email or web address])

## 2019-04-09 NOTE — Progress Notes (Signed)
* * *        **  Kavanagh, Cimberly A**    ------    73 Y old Female, DOB: Jan 27, 1946    [address], [place], [place] [address]    Home: [phone]    Provider: Cheryle Horsfall        * * *    Telephone Encounter    ---    Answered by    Maple Hudson LPN, Paulette    Date: 04/09/2019        Time: 12:13 PM    Message                      She has been taking her BP since being on the Ritilan  Bp today was 157/104.   Should she continue to take this medication  pls call [phone]        ------            Action Taken                      Rainer Mounce G 04/09/2019 1:48:02 PM >   called.   BP is too high and this is a known side effect of ritalin.  would discontinue.                    * * *                ---          * * *          Patient: Ferrelli, Jacquese A DOB: 1946/03/08 Provider: Cheryle Horsfall  04/09/2019    ---    Note generated by eClinicalWorks EMR/PM Software ([email or web address])

## 2019-04-11 ENCOUNTER — Ambulatory Visit (HOSPITAL_BASED_OUTPATIENT_CLINIC_OR_DEPARTMENT_OTHER): Admitting: Family Medicine

## 2019-04-11 NOTE — Progress Notes (Signed)
* * *        **  [name], [name]**    ------    35 Y old Female, DOB: [birth year]    [address], [place], [place] [address]    Home: [phone]    Provider: Cheryle Horsfall        * * *    Telephone Encounter    ---    Answered by    Maple Hudson LPN, Paulette    Date: 04/11/2019        Time: 10:29 AM    Message                      Bp is still running high  158/105  Off Ritalin for 2 days   no headaches   pls [phone]        ------            Action Taken                      Leonor Darnell G 04/11/2019 12:19:16 PM >  start lisinopril 10 mg daily.   I told her I would see her on Saturday at 9 AM      Young, LPN ,Paulette  01/06/4314 12:46:32 PM > can you put on Sar schedule pls      leavitt,alexandra  04/11/2019 12:53:42 PM > placed on sched for 9/19 OV med check                Refills    Start lisinopril tablet, 10 mg, orally, 30, 1 tab(s), once a day,  Refills=1    ------          * * *                ---          * * *          Patient: [name], [name] DOB: [birth year] Provider: Cheryle Horsfall  04/11/2019    ---    Note generated by eClinicalWorks EMR/PM Software ([email or web address])

## 2019-04-14 ENCOUNTER — Ambulatory Visit (HOSPITAL_BASED_OUTPATIENT_CLINIC_OR_DEPARTMENT_OTHER): Admitting: Family Medicine

## 2019-04-14 NOTE — Progress Notes (Signed)
** Progress Note  **    ---    **Patient:** Hannah [name], Hannah [name]    **Account Number:** [id number]    **Provider:** Electa Sniff, M.D    **DOB:** [birth year]  **Age:** 65 Y  **Sex:** Female    **Date:** 04/14/2019    **Phone:** [phone]    **Address:** [address], [place], [id number]        * * *        **Subjective:**        ---      **Chief Complaints:**    ------      1\. med check , okay per PH.    ------      **HPI:**    _Hypertension_ :    BP was high earlier in the week. no chest pain, no shortness of breath. no  palpitations. has had exacerbation of depression lately. no hx of htn. had  been started on ritalin recently.    73 year old female presents with c/o HYPERTENSION F/U  doing well and without  complaints  .    ------      **ROS:**    _CONSTITUTIONAL_ :    no  Fever.  no  Chills.    _ENT_ :    no  Rhinorrhea. Ringing in Ears  yes  . Hearing Loss  yes  .    _RESPIRATORY_ :    no  Shortness of Breath.  no  Persistent Cough.  no  Wheezing.    _CARDIOLOGY_ :    no  Chest Pain.  no  Palpitations.  no  Dizziness.  no  Leg Edema.    _GASTROENTEROLOGY_ :    c/o  Heartburn.    _UROLOGY_ :    no  Dysuria.    _MUSCULOSKELETAL_ :    c/o  Back Pain.    _NEUROLOGY_ :    Positive for  left eyelid drop  .  no  Tingling/Numbness.    _PSYCHOLOGY_ :    c/o  Depression.  c/o  Anxiety.    _DERMATOLOGY_ :    Positive for  squamous cell cancer of leg.  .        ------      **Medical History:** Transient global amnesia, acoustic Neuroma,  Osteoporosis, chronic GERD, Migraines, Lichen sclerosis, Left breast ductal  hyperplasia, Tinnitus, Diverticulosis- 9 inches of colon removed, colonoscopy:  07/14/11 with Dr.Marinelli, repeat in 5 years. mother had colon cancer,  Plantar fasciitis, Depression with anxiety, Hyperlipidemia.        ------      **Social History:** no Tobacco Use . no Alcohol. Marital Status: married.  no Drug use. no Exercise.    ------      **Medications:** Taking Zomig 5 mg tablet 1 tab(s)  orally once [name] day prn  migraine, Taking omeprazole 20 mg delayed release capsule 1 cap(s) orally once  [name] day, Taking atorvastatin 20 mg tablet 1 tab(s) orally once [name] day, Taking  Trintellix , Taking risperidone 0.25 mg tablet 1 tab(s) orally qd, Taking  lamotrigine 100 mg tablet 1 tab(s) orally qd, Taking calcium carbonate 600 mg  tablet 2 tab(s) orally daily, Taking Vitamin B12 500 mcg tablet 1 tab(s)  orally once [name] day, Taking Vitamin D3 2000 intl units tablet 1 tab(s) orally  once [name] day, Taking lamotrigine 25 mg tablet 1 tab(s) orally qd, Taking  lisinopril 10 mg tablet 1 tab(s) orally once [name] day, Medication List reviewed  and reconciled with the patient    ------      **  Allergies:** Codeine, Sulfamethizole.    ------        **Objective:**        ---      **Vitals:** HR **74** , BP **126/80** , Temp **98.7** , O2 Sat. **95** , Ht  59.5, Wt **183** , BMI  **36.34**    home cuff 121/83 this am. Wrist.    ------      **Examination:    ** _General Examination:_    General  alert and oriented  ,  well nourished and hydrated  ,  appropriate  attire and affect  .    HEENT:  left eyelid droop, complete hearing loss left ear  .    Oral cavity:  clear  ,  moist mucus membranes  .    Heart:  RRR  ,  no murmurs  .    Lungs:  clear to auscultation bilaterally  ,  no wheezes/rhonchi/rales  .    Abdomen:  soft, NT/ND, BS present  ,  no masses palpated  ,  no  hepatosplenomegaly  .    Back:  normal ROM of spine  .    Female Genitourinary:  per gyn  .    Extremities  no clubbing, no edema  .    Peripheral pulses:  normal (2+) bilaterally  .    Skin:  moist, warm  .    Neurologic Exam:  balance disturbance. left hearing loss and left eyelid droop  .        ------            **Assessment:**        ---      **Assessment:**        1\. Essential hypertension - I10 (Primary)    2\. Depression with anxiety - F41.8    3\. Cataract - H26.9    4\. Need for influenza vaccination - Z23    ------        **Plan:**        ---         **1\. Essential hypertension**    Notes: controlled on lisinopril. avoid salt and caffeine. start exercise  program.    ---    **2\. Depression with anxiety**    Notes: exacerbation lately. would stop ritalin, contributing to her high BP  and HR.    **3\. Cataract**    Notes: had surgery last month. still some residual visual issues since then.      **Immunizations:**    Influenza - Hi-dose : 0.5 mL (Dose No:1) (Route: Intramuscular) given by tara  gagnon, CMA on Right Deltoid    ------      **Procedure Codes:** 44975 FLU VACC PRSV FREE INC ANTIG    ------      **Follow Up:** prn    ------    ---    ---          **Provider:** Electa Sniff, M.D    ---    **Patient:** Hannah [name], Hannah [name] **DOB:** 1946/04/01 **Date:** 04/14/2019    ---    Electronically signed by Electa Sniff , MD on 04/14/2019 at 02:15 PM EDT    Sign off status: Completed

## 2019-05-22 ENCOUNTER — Ambulatory Visit (HOSPITAL_BASED_OUTPATIENT_CLINIC_OR_DEPARTMENT_OTHER): Admitting: Family Medicine

## 2019-05-22 NOTE — Progress Notes (Signed)
* * *      Hannah [name], Hannah [name] **DOB:** 04-Aug-1945 (73 yo F) **Acc No.** [id number] **DOS:**  05/22/2019    ---        Hannah [name], Hannah [name]**    ------    60 Y old Female, DOB: 10-31-1945    [address], [place], [place] [address]    Home: [phone]    Provider: Cheryle Horsfall        * * *    Telephone Encounter    ---    Answered by    Maple Hudson LPN, Paulette    Date: 05/22/2019        Time: 03:25 PM    Caller    pt    ------            Message                      noticed the past month that BP has been running 127/93/124/90  pulse is running around  96   should she get off the medication  she also has [name] cough not sure if this is [name] side effect                Action Taken                      Jermaine Neuharth G 05/22/2019 5:41:10 PM >  still needs medication based on these #s.   but lisinopril is likely causing the cough.    lets switch to losartan 50 mg daily.   where would she like [place] to send it.      Young, LPN ,Paulette  94/50/3888 10:55:40 AM > [facility]      Kashawn Dirr G 05/23/2019 11:16:33 AM >  sent                Refills    Start losartan tablet, 50 mg, orally, 30, 1 tab(s), once [name] day,  Refills=1    ------          * * *                ---          * * *          Provider: Cheryle Horsfall 05/22/2019    ---    Note generated by eClinicalWorks EMR/PM Software ([email or web address])

## 2019-06-13 ENCOUNTER — Ambulatory Visit (HOSPITAL_BASED_OUTPATIENT_CLINIC_OR_DEPARTMENT_OTHER): Admitting: Family Medicine

## 2019-06-13 NOTE — Progress Notes (Signed)
* * *      Hannah [name], Hannah [name] **DOB:** [birth year] (73 yo F) **Acc No.** [id number] **DOS:**  06/13/2019    ---        Terri Skains, Hannah [name]**    ------    25 Y old Female, DOB: [birth year]    [address], [place], [place] [address]    Home: [phone]    Provider: Cheryle Horsfall        * * *    Telephone Encounter    ---    Answered by    Cheryle Horsfall    Date: 06/13/2019        Time: 08:23 AM    Message                      losartan        ------            Action Taken                      Takeria Marquina G 06/13/2019 8:23:36 AM > erx done                Refills    Continue losartan tablet, 50 mg, orally, 90, 1 tab(s), once [name] day,  Refills=1    ------          * * *                ---          * * *          Provider: Cheryle Horsfall 06/13/2019    ---    Note generated by eClinicalWorks EMR/PM Software ([email or web address])

## 2019-08-23 ENCOUNTER — Ambulatory Visit (HOSPITAL_BASED_OUTPATIENT_CLINIC_OR_DEPARTMENT_OTHER): Admitting: Family Medicine

## 2019-08-23 NOTE — Progress Notes (Signed)
* * *      Bento, Sheleen A **DOB:** 08/04/1945 (73 yo F) **Acc No.** [id number] **DOS:**  08/23/2019    ---        Terri Skains, Matina A**    ------    10 Y old Female, DOB: [birth year]    [address], [place], [place] [address]    Home: [phone]    Provider: Cheryle Horsfall        * * *    Telephone Encounter    ---    Answered by    Cheryle Horsfall    Date: 08/23/2019        Time: 08:48 AM    Message                      atorvastatin        ------            Action Taken                      Makesha Belitz G 08/23/2019 8:48:58 AM > erx done                Refills    Continue atorvastatin tablet, 20 mg, orally, 1 tab(s), once a day    ------          * * *                ---          * * *          Provider: Cheryle Horsfall 08/23/2019    ---    Note generated by eClinicalWorks EMR/PM Software ([email or web address])

## 2019-11-21 ENCOUNTER — Ambulatory Visit (HOSPITAL_BASED_OUTPATIENT_CLINIC_OR_DEPARTMENT_OTHER): Admitting: Family Medicine

## 2020-01-02 ENCOUNTER — Ambulatory Visit

## 2020-01-15 ENCOUNTER — Ambulatory Visit (HOSPITAL_BASED_OUTPATIENT_CLINIC_OR_DEPARTMENT_OTHER): Admitting: Family Medicine

## 2020-01-15 NOTE — Progress Notes (Signed)
* * *      Swider, Carmelia A **DOB:** 06/19/1946 (73 yo F) **Acc No.** [id number] **DOS:**  01/15/2020    ---        Terri Skains, Summit A**    ------    86 Y old Female, DOB: [birth year]    [address], [place], [place] [address]    Home: [phone]    Provider: Cheryle Horsfall        * * *    Telephone Encounter    ---    Answered by    Maple Hudson LPN, Paulette    Date: 01/15/2020        Time: 11:33 AM    Caller    pt    ------            Reason    Refills            Action Taken                      Rosaland Shiffman G 01/15/2020 1:21:49 PM >  does she want it sent to [place]?      Young, LPN ,Paulette  6/41/5830 2:01:50 PM > yes      Laquanda Bick G 01/15/2020 6:31:00 PM >  erx done                Refills    Refill losartan tablet, 50 mg, orally, 90, 1 tab(s), once a day,  Refills=1    ------          * * *                ---          * * *          Provider: Cheryle Horsfall 01/15/2020    ---    Note generated by eClinicalWorks EMR/PM Software ([email or web address])

## 2020-03-03 ENCOUNTER — Ambulatory Visit (HOSPITAL_BASED_OUTPATIENT_CLINIC_OR_DEPARTMENT_OTHER): Admitting: Family Medicine

## 2020-03-03 NOTE — Progress Notes (Signed)
* * *      [name], Hannah [name] **DOB:** Oct 31, 1945 (73 yo F) **Acc No.** [id number] **DOS:**  03/03/2020    ---        Hannah [name], Hannah [name]**    ------    67 Y old Female, DOB: July 23, 1946    [address], [place], [place] [address]    Home: [phone]    Provider: Cheryle Horsfall        * * *    Telephone Encounter    ---    Answered by    Wilfrid Lund    Date: 03/03/2020        Time: 04:48 PM    Caller    pt calling    ------            Reason    diarrhea            Message                      states that for the past month she has had on/off liquid stools, comes on fast when it happens.  States that her mother died of colon CA, that she has had 9" of her colon removed already.  Not sure if she should see Dr. Gwendel Hanson or see GI, feels she needs to get an upper endoscopy and [name], states she has been having heartburn which she never has as she is on Prilosec.      callback # [phone]                Action Taken                      Cheryle Horsfall 03/03/2020 5:12:41 PM >  can start off here with OV      Prugh,RN ,Melanie  03/03/2020 5:32:43 PM > called pt., no answer, LMTCB on voicemail                    * * *                ---          * * *          Provider: Cheryle Horsfall 03/03/2020    ---    Note generated by eClinicalWorks EMR/PM Software ([email or web address])

## 2020-03-03 NOTE — Progress Notes (Signed)
* * *      [name], Hannah [name] **DOB:** November 03, 1945 (73 yo F) **Acc No.** [id number] **DOS:**  03/03/2020    ---        Hannah [name], Hannah [name]**    ------    62 Y old Female, DOB: [birth year]    [address], [place], [place] [address]    Home: [phone]    Provider: Cheryle Horsfall        * * *    Telephone Encounter    ---    Answered by    Hannah [name]    Date: 03/03/2020        Time: 03:43 PM    Reason    rx    ------            Message                      ATORVASTATIN 20 MG                 Action Taken                      [name],Hannah  03/03/2020 3:44:21 PM > last ov 04/14/19      Hannah [name] 03/03/2020 5:44:56 PM >  erx done                Refills    Refill atorvastatin tablet, 20 mg, orally, 90, 1 tab(s), once [name]  day, Refills=1    ------          * * *                ---          * * *          Provider: Cheryle Horsfall 03/03/2020    ---    Note generated by eClinicalWorks EMR/PM Software ([email or web address])

## 2020-03-06 ENCOUNTER — Ambulatory Visit (HOSPITAL_BASED_OUTPATIENT_CLINIC_OR_DEPARTMENT_OTHER): Admitting: Family Medicine

## 2020-03-06 NOTE — Progress Notes (Signed)
* * *      [name], Hannah [name] **DOB:** January 07, 1946 (73 yo F) **Acc No.** [id number] **DOS:**  03/06/2020    ---        Hannah [name], Hannah [name]**    ------    63 Y old Female, DOB: 1946/03/07    [address], [place], [place] [address]    Home: [phone]    Provider: Cheryle Horsfall        * * *    Telephone Encounter    ---    Answered by    Tona Sensing, CMA, Delice Bison    Date: 03/06/2020        Time: 11:38 AM    Message                      atorvastatin        ------            Refills    Refill atorvastatin tablet, 20 mg, orally, 90, 1 tab(s), once [name]  day, Refills=1    ------          * * *                ---          * * *          Provider: Cheryle Horsfall 03/06/2020    ---    Note generated by eClinicalWorks EMR/PM Software ([email or web address])

## 2020-07-03 ENCOUNTER — Ambulatory Visit (HOSPITAL_BASED_OUTPATIENT_CLINIC_OR_DEPARTMENT_OTHER): Admitting: Family Medicine

## 2020-07-03 ENCOUNTER — Ambulatory Visit: Admitting: Family Medicine

## 2020-07-03 LAB — HX HEPATIC FUNCTION PANEL
CASE NUMBER: 2021343002431
HX ALBUMIN LVL: 4 g/dL — NL (ref 3.2–5.0)
HX ALKALINE PHOSPHATASE: 110 U/L — NL (ref 30.0–117.0)
HX ALT: 43 U/L — NL (ref 6.0–55.0)
HX AST: 33 U/L — NL (ref 6.0–40.0)
HX BILIRUBIN DIRECT: 0.1 mg/dL — NL (ref 0.0–0.3)
HX BILIRUBIN TOTAL: 0.6 mg/dL — NL (ref 0.2–1.2)
HX TOTAL PROTEIN: 8.1 g/dL — NL (ref 6.0–8.4)

## 2020-07-03 LAB — HX BASIC METABOLIC PANEL
CASE NUMBER: 2021343002431
HX ANION GAP: 9 — NL (ref 3.0–11.0)
HX BUN: 13 mg/dL — NL (ref 8.0–23.0)
HX CALCIUM LVL: 9.5 mg/dL — NL (ref 8.5–10.5)
HX CHLORIDE: 108 mmol/L — NL (ref 98.0–110.0)
HX CO2: 24 mmol/L — NL (ref 21.0–32.0)
HX CREATININE: 1.07 mg/dL — NL (ref 0.55–1.3)
HX GLUCOSE LVL: 100 mg/dL — NL (ref 70.0–110.0)
HX POTASSIUM LVL: 3.9 mmol/L — NL (ref 3.6–5.2)
HX SODIUM LVL: 141 mmol/L — NL (ref 136.0–146.0)

## 2020-07-03 LAB — HX CBC W/ INDICES
CASE NUMBER: 2021343002431
HX ABSOLUTE NRBC COUNT: 0 10*3/uL
HX HCT: 50.1 % — ABNORMAL HIGH (ref 36.0–47.0)
HX HGB: 15.7 g/dL — NL (ref 11.8–16.0)
HX MCH: 28.3 pg — NL (ref 26.0–34.0)
HX MCHC: 31.3 g/dL — NL (ref 31.0–37.0)
HX MCV: 90.4 fL — NL (ref 80.0–100.0)
HX MPV: 9.4 fL — NL (ref 9.4–12.4)
HX NRBC PERCENT: 0 % — NL
HX PLATELET: 366 10*3/uL — NL (ref 150.0–400.0)
HX RBC: 5.54 10*6/uL — ABNORMAL HIGH (ref 3.9–5.2)
HX RDW-CV: 13.5 % — NL (ref 11.5–14.5)
HX RDW-SD: 45.1 fL — NL (ref 35.0–51.0)
HX WBC: 6.8 10*3/uL — NL (ref 3.7–11.2)

## 2020-07-03 LAB — HX LIPID PANEL
CASE NUMBER: 2021343002431
HX CHOL: 187 mg/dL — NL
HX HDL: 83 mg/dL — NL
HX LDL: 79 mg/dL — NL
HX TRIG: 123 mg/dL — NL

## 2020-07-03 LAB — HX GLOMERULAR FILTRATION RATE (ESTIMATED)
CASE NUMBER: 2021343002431
HX AFN AMER GLOMERULAR FILTRATION RATE: 60 mL/min/{1.73_m2}
HX NON-AFN AMER GLOMERULAR FILTRATION RATE: 52 mL/min/{1.73_m2}

## 2020-07-03 LAB — HX TSH
CASE NUMBER: 2021343002431
HX 3RD GEN TSH: 1.13 u[IU]/mL — NL (ref 0.358–3.74)

## 2020-07-03 LAB — HX VITAMIN D 25 HYDROXY LEVEL (RECOMMENDED)
CASE NUMBER: 2021343002431
HX VITAMIN D 25 OH LVL: 34 ng/mL — NL (ref 30.0–100.0)

## 2020-07-03 NOTE — Progress Notes (Signed)
* * *      Sells, Corrin A **DOB:** 21-Jan-1946 (73 yo F) **Acc No.** [id number] **DOS:**  07/03/2020    ---        Terri Skains, Arlesia A**    ------    64 Y old Female, DOB: 04-18-1946    [address], [place], [place] [address]    Home: [phone]    Provider: Cheryle Horsfall        * * *    Telephone Encounter    ---    Answered by    Cheryle Horsfall    Date: 07/03/2020        Time: 05:26 PM    Reason    results    ------            Message                      labs:  nl except hct a little high.   Xray shows  DJD of knee                Action Taken                      Merik Mignano G 07/03/2020 5:26:49 PM >  consider sleep apnea.   will need psych med adjustment                    * * *                ---          * * *          Provider: Cheryle Horsfall 07/03/2020    ---    Note generated by eClinicalWorks EMR/PM Software ([email or web address])

## 2020-07-03 NOTE — Progress Notes (Signed)
Hannah Hannah [name], Hannah Hannah [name] **DOB:** 1945-12-24 (73 yo F) **Acc No.** [id number] **DOS:**  07/03/2020    ---      ** Progress Note  **    ---    **Patient:** Hannah Hannah [name]    **Account Number:** [id number]    **Provider:** Electa Sniff, M.D    **DOB:** 24-Mar-1946  **Age:** 55 Y  **Sex:** Female    **Date:** 07/03/2020    **Phone:** [phone]    **Address:** [address], [place] Hannah [name], Hannah Hannah [name]        * * *        **Subjective:**        ---      **Chief Complaints:**    ------      1\. Medicare annual visit/ having knee pain. 2. very depressed see PHQ9. 3.  Left knee pain x 3 weeks.    ------      **HPI:**    _Interim History_ :    74 year old female presents with c/o Consultations  01/20/16 derm consult-see  scanned note.  Marland Kitchen    Here for physical at age 23. depression is worse lately. does not leave the  house. chronic fatigue. left knee pain. no recent injury. hx of left acoustic  neuroma, chronic GERD, depression/anxiety, lichen sclerosis, hyperlipidemia.  no recent hospital stays. lives in [place] much of the year.    _Health Screening_ :    Fall Risk Screening Have you fallen in the past year? No, Patient screened for  falls on: 07/06/2020. Diabetes  Is the patient's A1C in control (non diabetic  . Depression Was the patient screened for depression in the current year? Yes,  PHQ-9 performed score greater than 9 positive for depression, The patient's  follow up plan is: Patient referred to psychiatry or psychotherapy, Medication  started/continue/changed. Breast Cancer Screening  Patient yesscreened within  2 years: _  . Colorectal Cancer Screening Patient had: Colonoscopy within 9  years. Influenza Vaccination (08/01-03/31) Documented for current  year(administered in office or elsewhere) Yes. Tobacco Use Was the patient  screened for tobacco use (current year): Tobacco Non-user. Statin Therapy Does  the patient have any of the following: ASCVD/CAD, Is the patient on Hannah [name] statin?  Yes. Cost of Medication  Pt is able  to pay  . Pneumonia Vaccination Documented  (administered in office or elsewhere): Yes. BMI Screening (18.5-24.9) Within  range: No, Weight Management Counseling and follow up at next appointment:  Discussed diet and exercise plan to lower BMI. Medication Reconciliation Post-  Discharge Has the patient recently been discharged from an inpatient faclity  (hospital, skilled nursing home, rehab) and seen within 30 days? No.    _PHQ Form_ :    PHQ9 Form Little interest or pleasure in doing things Not at all, Feeling  down, depressed, hopeless Not at all, Trouble falling or staying asleep, or  sleeping to much Not at all, Feeling tired or little energy Not at all, Poor  appetite or overeating Not at all, Feeling bad about yourself-or that you are  Hannah [name] failure or let yourself or your family down Not at all, Trouble  concentrating on things such as reading the newspaper or watching television  Not at all, Moving or speaking so slowly that other people could have noticed.  or the opposite - being so fidgety or restless that you have been moving  around Hannah [name] lot more than usual Not at all, Thoughts that you were better off  dead, or of hurting yourself in someway? Not at  all, Total score 0\.    ------      **ROS:**    _CONSTITUTIONAL_ :    no  Fever.  no  Chills.    _ENT_ :    no  Rhinorrhea. Ringing in Ears  yes  . Hearing Loss  yes  .    _RESPIRATORY_ :    no  Shortness of Breath.  no  Persistent Cough.  no  Wheezing.    _CARDIOLOGY_ :    no  Chest Pain.  no  Palpitations.  no  Dizziness.  no  Leg Edema.    _GASTROENTEROLOGY_ :    c/o  Heartburn.    _UROLOGY_ :    no  Dysuria.    _MUSCULOSKELETAL_ :    c/o  Back Pain.    _NEUROLOGY_ :    Positive for  left eyelid drop  ,  left eyelid drop  .  no  Tingling/Numbness.    _PSYCHOLOGY_ :    c/o  Depression.  c/o  Anxiety.    _DERMATOLOGY_ :    Positive for  squamous cell cancer of leg.  ,  squamous cell cancer of leg.  .        ------      **Medical History:** Transient global  amnesia, acoustic Neuroma,  Osteoporosis, chronic GERD, Migraines, Lichen sclerosis, Left breast ductal  hyperplasia, Tinnitus, Diverticulosis- 9 inches of colon removed, colonoscopy:  07/14/11 with Dr.Marinelli, repeat in 5 years. mother had colon cancer,  Plantar fasciitis, Depression with anxiety, Hyperlipidemia.        ------      **Surgical History:** tonsillectomy/adenoidectomy (as Hannah [name] child) ,  appendectomy 1971, rectal fissurectomies 1973, diagnostic lap (corpus luteum)  1981, Vag hyst BSO post colporrhaphy with culdoplasty (pelvic relaxation)  1999, acoustic neuroma .    ------      **Hospitalization/Major Diagnostic Procedure:** # 3 vaginal deliveried ,  See Surgical hx .    ------      **Social History:** no Tobacco Use . no Alcohol. no Drug use. no Exercise.  Marital Status: married.    ------      **Medications:** Taking Vraylar , Taking venlafaxine 75 mg capsule,  extended release 1 cap(s) orally once Hannah [name] day, Taking Zomig 5 mg tablet 1 tab(s)  orally once Hannah [name] day prn migraine, Taking omeprazole 20 mg delayed release  capsule 1 cap(s) orally once Hannah [name] day, Taking lamoTRIgine 100 mg tablet 1 tab(s)  orally 2 times Hannah [name] day, Taking calcium carbonate 600 mg tablet 2 tab(s) orally  daily, Taking Vitamin B12 500 mcg tablet 1 tab(s) orally once Hannah [name] day, Taking  Vitamin D3 2000 intl units tablet 1 tab(s) orally once Hannah [name] day, Taking losartan  50 mg tablet 1 tab(s) orally once Hannah [name] day, Taking atorvastatin 20 mg tablet 1  tab(s) orally once Hannah [name] day, Discontinued Trintellix , Discontinued lamoTRIgine  25 mg tablet 1 tab(s) orally qd, Discontinued lisinopril 10 mg tablet 1 tab(s)  orally once Hannah [name] day, Discontinued risperiDONE 0.25 mg tablet 1 tab(s) orally qd,  Medication List reviewed and reconciled with the patient    ------      **Allergies:** Codeine, Sulfamethizole.    ------        **Objective:**        ---      **Vitals:** BP **138/80** , Temp **98.2** , Ht 59.5, Wt **192** , Wt Change  9 lb, BMI  **38.13** .     ------      **Examination:    **  _General Examination:_    General  NAD  ,  overweight  ,  pleasant  .    HEENT:  left eyelid droop, complete hearing loss left ear  .    Oral cavity:  clear  ,  moist mucus membranes  .    Heart:  RRR  ,  no murmurs  .    Lungs:  clear to auscultation bilaterally  ,  no wheezes/rhonchi/rales  .    Abdomen:  soft, NT/ND, BS present  ,  no masses palpated  ,  no  hepatosplenomegaly  .    Back:  normal ROM of spine  .    Female Genitourinary:  per gyn  .    Extremities  tender left knee  .    Peripheral pulses:  normal (2+) bilaterally  .    Skin:  moist, warm  .    Neurologic Exam:  balance disturbance. left hearing loss and left eyelid droop  .        ------            **Assessment:**        ---      **Assessment:**        1\. Knee pain - M25.569 (Primary)    2\. Depression with anxiety - F41.8    3\. Essential hypertension - I10    4\. Acoustic neuroma - D33.3    5\. Hyperlipidemia - E78.5    6\. Family hx of colon cancer - Z80.0    7\. Osteopenia determined by x-ray - M85.80    8\. Chronic GERD - K21.9    9\. Vitamin D deficiency - E55.9    10\. Need for influenza vaccination - Z23    11\. Lichen sclerosus - L90.0    12\. Adult general medical exam - Z00.00    ------        **Plan:**        ---        **1\. Knee pain**    _Imaging: XR Knee 3 Views Left e*_ osteoarthritis    Notes: likely OA there. check Xray. ice, NSAIDs, exercises. discussed tx  options.    ---    **2\. Depression with anxiety**    _LAB: TSH_ Normal      Value    Reference Range    ---------    3rd Gen TSH    1.130      0.358-3.740 - mclU/ml        Notes: exacerbation lately. on vraylar and lamictal for presumed bipolar, but  clinically more depressed . did well on paxil monotherapy in the past.  consider restarting this with her psychiatrist. check labs.    **3\. Essential hypertension**    _LAB: Basic Metabolic Panel_ Normal      Value    Reference Range    ---------    Creatinine    1.070       0.550-1.300 - mg/dL    Sodium Lvl    846      136-146 - mmol/L    ------------    Potassium Lvl    3.9      3.6-5.2 - mmol/L    ------------    Chloride    108      98-110 - mmol/L    ------------    CO2    24      21-32 - mmol/L    ------------    Calcium Lvl    9.5  8.5-10.5 - mg/dL    ------------    Glucose Lvl    100      70-110 - mg/dL    ------------    Anion Gap    9      3-11 -    ------------    BUN    13      8-23 - mg/dL    ------------    _LAB: CBC w/ Indices_ hct 50    Value    Reference Range    ---------    WBC    6.8      3.7-11.2 - thous/mm3    RBC    5.54    H    3.90-5.20 - Mil/mm3    ------------    Hgb    15.7      11.8-16.0 - Gm/dL    ------------    Hct    50.1    H    36.0-47.0 - %    ------------    MCV    90.4      80.0-100.0 - fL    ------------    MCH    28.3      26.0-34.0 - pGm    ------------    MCHC    31.3      31.0-37.0 - Gm/dL    ------------    Platelet    366      150-400 - thous/mm3    ------------    RDW-SD    45.1      35.0-51.0 - fL    ------------    MPV    9.4      9.4-12.4 - fL    ------------        Notes: controlled. avoid salt and caffeine. start exercise program.    **4\. Acoustic neuroma**    Notes:  left sided. had surgery in past. left with left ear deafness, balance  disturbance, left eyelid droop.  .    **5\. Hyperlipidemia**    _LAB: Hepatic Function Panel_ Normal      Value    Reference Range    ---------    Albumin Lvl    4.0      3.2-5.0 - Gm/dL    Alk Phos    540      30-117 - Units/L    ------------    AST    33      6-40 - Units/L    ------------    Bili Direct    0.1      0.0-0.3 - mg/dL    ------------    Bili Total    0.6      0.2-1.2 - mg/dL    ------------    Total Protein    8.1      6.0-8.4 - Gm/dL    ------------    ALT    43      6-55 - Units/L    ------------    _LAB: Lipid Panel_  Normal    Value    Reference Range    ---------    Chol    187      <=200 - mg/dL    HDL    83      >=98 - mg/dL    ------------    Trig    123      <=150 - mg/dL    ------------    LDL    79      <=130 - mg/dL    ------------  Notes:    lipids fine when last checked. recheck    .    **6\. Family hx of colon cancer**    Notes:    colonoscopy up to date    .    **7\. Osteopenia determined by x-ray**    _LAB: Vitamin D 25 Hydroxy Level_ 34      Value    Reference Range    ---------    Vitamin D 25 OH Lvl    34      30-100 - nGm/ml        Notes:  calcium, exercise, vitamin d.  .    **8\. Chronic GERD**    Notes:  on PPI long term, unable to stop  .    **9\. Vitamin D deficiency**    Notes:  recheck. was on supplements.  .    **10\. Lichen sclerosus**    Notes:  per gyn  .    **11\. Adult general medical exam**    Notes: mammo up to date. colonoscopy up to date. vaccines up to date.  encourage exercise and socialization.      **Follow Up:** 6 Months, prn    ------    ---    ---                ---    Electronically signed by Electa Sniff , MD on 07/06/2020 at 03:18 PM EST    Sign off status: Completed          * * *      **Provider:** Electa Sniff, M.D    **Date:** 07/03/2020    ------

## 2020-07-04 ENCOUNTER — Ambulatory Visit (HOSPITAL_BASED_OUTPATIENT_CLINIC_OR_DEPARTMENT_OTHER): Admitting: Family Medicine

## 2020-07-04 NOTE — Progress Notes (Signed)
* * *      [name], Hannah [name] **DOB:** April 10, 1946 (73 yo F) **Acc No.** [id number] **DOS:**  07/04/2020    ---        Hannah [name], Hannah [name]**    ------    15 Y old Female, DOB: September 01, 1945    [address], [place], [place] [address]    Home: [phone]    Provider: Cheryle Horsfall        * * *    Telephone Encounter    ---    Answered by    Alben Deeds    Date: 07/04/2020        Time: 11:58 AM    Reason    re: sleep study    ------            Message                      pt called, says she would like to have sleep study that she was advised to get.                 Action Taken                      Waino Mounsey G 07/07/2020 3:19:06 PM >  OK pls set up telehealth visit with Darel Hong,  I have to fill out Epworth Scale and  note [name] few other details before  this test will be covered.  tx [facility]      [place],Datnery  07/07/2020 3:44:34 PM > booked 12/15 with [facility]                    * * *                ---          * * *          Provider: Cheryle Horsfall 07/04/2020    ---    Note generated by eClinicalWorks EMR/PM Software ([email or web address])

## 2020-07-06 ENCOUNTER — Encounter

## 2020-07-09 ENCOUNTER — Ambulatory Visit (HOSPITAL_BASED_OUTPATIENT_CLINIC_OR_DEPARTMENT_OTHER): Admitting: Family Medicine

## 2020-07-09 NOTE — Progress Notes (Signed)
[name], [name] **DOB:** Nov 17, 1945 (74 yo F) **Acc No.** [id number] **DOS:**  07/09/2020    ---      ** Progress Notes  **    ---    **Patient:** Hannah [name]    **Account Number:** [id number]    **Provider:** Electa Sniff, M.D    **DOB:** April 24, 1946  **Age:** 74 Y  **Sex:** Female    **Date:** 07/09/2020    **Phone:** [phone]    **Address:** [address], [place], [id number]        * * *        **Subjective:**        ---      **Chief Complaints:**    ------      1\. Discuss sleep study.    ------      **HPI:**    _Follow-up_ :    Called. has influenza so can't do televisit today.    ------      **Medical History:**        ------      **Medications:** Taking Vraylar , Taking venlafaxine 75 mg capsule,  extended release 1 cap(s) orally once a day, Taking Zomig 5 mg tablet 1 tab(s)  orally once a day prn migraine, Taking omeprazole 20 mg delayed release  capsule 1 cap(s) orally once a day, Taking lamoTRIgine 100 mg tablet 1 tab(s)  orally 2 times a day, Taking calcium carbonate 600 mg tablet 2 tab(s) orally  daily, Taking Vitamin B12 500 mcg tablet 1 tab(s) orally once a day, Taking  Vitamin D3 2000 intl units tablet 1 tab(s) orally once a day, Taking losartan  50 mg tablet 1 tab(s) orally once a day, Taking atorvastatin 20 mg tablet 1  tab(s) orally once a day    ------        **Objective:**        ---        **Assessment:**        ---        **Plan:**        ---        ------    ---    ---                ---    Electronically signed by Electa Sniff , MD on 07/27/2020 at 04:42 PM EST    Sign off status: Completed          * * *      **Provider:** Electa Sniff, M.D    **Date:** 07/09/2020    ------

## 2020-08-14 ENCOUNTER — Ambulatory Visit

## 2020-10-03 ENCOUNTER — Ambulatory Visit (HOSPITAL_BASED_OUTPATIENT_CLINIC_OR_DEPARTMENT_OTHER): Admitting: Family Medicine

## 2020-10-03 NOTE — Progress Notes (Signed)
* * *      [name], Hannah [name] **DOB:** 1945-10-28 (74 yo F) **Acc No.** [id number] **DOS:**  10/03/2020    ---        Terri Skains, Hannah [name]**    ------    40 Y old Female, DOB: October 15, 1945    [address], [place], [place] [address]    Home: [phone]    Provider: Cheryle Horsfall        * * *    Telephone Encounter    ---    Answered by    Cheryle Horsfall    Date: 10/03/2020        Time: 12:17 PM    Reason    refill    ------            Message                      losartan 50                Action Taken                      Shiva Karis G 10/03/2020 12:18:19 PM > erx done                Refills    Continue losartan tablet, 50 mg, orally, 90, 1 tab(s), once [name] day,  Refills=1    ------          * * *                ---          * * *          Provider: Cheryle Horsfall 10/03/2020    ---    Note generated by eClinicalWorks EMR/PM Software ([email or web address])

## 2020-11-05 ENCOUNTER — Other Ambulatory Visit (INDEPENDENT_AMBULATORY_CARE_PROVIDER_SITE_OTHER): Admitting: Family Medicine

## 2020-12-26 ENCOUNTER — Telehealth (INDEPENDENT_AMBULATORY_CARE_PROVIDER_SITE_OTHER)

## 2020-12-26 NOTE — Telephone Encounter (Signed)
Daughter lm stating she is playing phone tag, but nothing in chart. Was in [place] seeing a psych provider and they placed her on lithium a few weeks ago. Was on 900 mg, was very shaky so they told her to drop to 300 mg. Asking if she can have labs done by [place].

## 2020-12-27 ENCOUNTER — Encounter

## 2020-12-27 ENCOUNTER — Encounter (INDEPENDENT_AMBULATORY_CARE_PROVIDER_SITE_OTHER)

## 2020-12-28 ENCOUNTER — Other Ambulatory Visit (INDEPENDENT_AMBULATORY_CARE_PROVIDER_SITE_OTHER): Admitting: Family Medicine

## 2020-12-28 NOTE — Telephone Encounter (Signed)
OK for labs.   I transmitted order

## 2020-12-29 ENCOUNTER — Ambulatory Visit: Admit: 2020-12-29 | Discharge: 2020-12-29 | Payer: MEDICARE | Attending: Family Medicine | Primary: Family Medicine

## 2020-12-29 ENCOUNTER — Other Ambulatory Visit: Admit: 2020-12-29 | Payer: MEDICARE | Primary: Family Medicine

## 2020-12-29 ENCOUNTER — Other Ambulatory Visit

## 2020-12-29 VITALS — BP 120/80 | Temp 98.2°F | Wt 183.0 lb

## 2020-12-29 DIAGNOSIS — R251 Tremor, unspecified: Secondary | ICD-10-CM

## 2020-12-29 DIAGNOSIS — F319 Bipolar disorder, unspecified: Secondary | ICD-10-CM

## 2020-12-29 LAB — LIPID PANEL
Cholesterol: 128 mg/dL (ref <=200)
HDL cholesterol: 51 mg/dL (ref >=40)
LDL cholesterol, calculated: 52 mg/dL (ref 0–130)
Triglycerides: 126 mg/dL (ref <=150)

## 2020-12-29 LAB — CBC WITH DIFFERENTIAL
Basophils %: 1.6 %
Basophils Absolute: 0.11 10*3/uL (ref 0.00–0.22)
Eosinophils %: 3.6 %
Eosinophils Absolute: 0.25 10*3/uL (ref 0.00–0.50)
Hematocrit: 44.4 % (ref 32.0–47.0)
Hemoglobin: 14 g/dL (ref 11.0–16.0)
Immature Granulocytes %: 0.4 %
Immature Granulocytes Absolute: 0.03 10*3/uL (ref 0.00–0.10)
Lymphocyte %: 24.2 %
Lymphocytes Absolute: 1.7 10*3/uL (ref 0.70–4.00)
MCH: 29.2 pg (ref 26.0–34.0)
MCHC: 31.5 g/dL (ref 31.0–37.0)
MCV: 92.5 fL (ref 80.0–100.0)
MPV: 10.3 fL (ref 9.1–12.4)
Monocytes %: 10 %
Monocytes Absolute: 0.7 10*3/uL (ref 0.36–0.77)
NRBC %: 0 % (ref 0.0–0.0)
NRBC Absolute: 0 10*3/uL (ref 0.00–2.00)
Neutrophil %: 60.2 %
Neutrophils Absolute: 4.24 10*3/uL (ref 1.50–7.95)
Platelets: 336 10*3/uL (ref 150–400)
RBC: 4.8 M/uL (ref 3.70–5.20)
RDW-CV: 13.8 % (ref 11.5–14.5)
RDW-SD: 47.4 fL (ref 35.0–51.0)
WBC: 7 10*3/uL (ref 4.0–11.0)

## 2020-12-29 LAB — BASIC METABOLIC PANEL
Anion Gap: 5 mmol/L (ref 3–14)
BUN: 16 mg/dL (ref 6–24)
CO2 (Bicarbonate): 29 mmol/L (ref 20–32)
Calcium: 9.6 mg/dL (ref 8.5–10.5)
Chloride: 107 mmol/L (ref 98–110)
Creatinine: 1.24 mg/dL (ref 0.55–1.30)
Glucose: 95 mg/dL (ref 70–110)
Potassium: 3.9 mmol/L (ref 3.6–5.2)
Sodium: 141 mmol/L (ref 135–146)
eGFRcr: 46 mL/min/{1.73_m2} — ABNORMAL LOW (ref >=60)

## 2020-12-29 LAB — TSH: TSH: 2.87 u[IU]/mL (ref 0.358–3.740)

## 2020-12-29 LAB — LITHIUM LEVEL: Lithium level: 0.8 mmol/L (ref 0.5–1.2)

## 2020-12-29 NOTE — Telephone Encounter (Signed)
Call to pt dtr Durward Mallard and advised.

## 2020-12-29 NOTE — Progress Notes (Signed)
[facility]  [facility]  [address]  [address]  [place] [address]  Dept: [phone]  Dept Fax: [phone]     Patient ID: Hannah [name] is a 75 y.o. female who presents for ov (Reaction to lithium).    Subjective   HPI   Here in follow up.   Her medications have been adjusted lately.    Started on lithium.  And felt tremulous and shaky. Was vomiting.    Was on 900 mg and then decreased to 300 mg.    No headaches.   Sinuses OK.  Breathing  OK. Sedentary.   Bowels are constipated.      No swelling of legs.   Less tremulous.       Current Outpatient Medications   Medication Instructions   ? atorvastatin (Lipitor) 20 mg tablet Take 1 tablet by mouth once daily   ? CALCIUM CARBONATE ORAL 600 mg, oral, Daily RT   ? cholecalciferol (Vitamin D-3) 50 MCG (2000 UT) tablet 1 tablet, oral, Daily RT   ? cyanocobalamin (VITAMIN B-12) 500 mcg, oral, Daily RT   ? lithium 300 mg, oral, Daily   ? losartan (COZAAR) 50 mg, oral, Daily RT   ? omeprazole (PriLOSEC) 20 mg DR capsule omeprazole 20 mg capsule,delayed release   ? PARoxetine (PAXIL) 40 mg, oral, Every morning   ? ZOLMitriptan (Zomig) 5 mg tablet zolmitriptan 5 mg tablet     Review of Systems   Constitutional: Negative.    HENT: Positive for hearing loss.    Respiratory: Negative.    Cardiovascular: Negative.    Gastrointestinal: Negative.    Genitourinary: Negative.    Musculoskeletal: Positive for neck pain.   Skin: Negative.    Neurological: Positive for dizziness and tremors.   Psychiatric/Behavioral: Negative for depression.     Objective   Visit Vitals  BP 120/80   Temp 36.8 ?C (98.2 ?F)   Wt 83 kg   BMI 36.34 kg/m?   BSA 1.87 m?       Physical Exam  BP 120/80   Temp 36.8 ?C (98.2 ?F)   Wt 83 kg   BMI 36.34 kg/m?     General Appearance:    Alert, cooperative, no distress, appears stated age   Head:    Normocephalic, without obvious abnormality, atraumatic   Eyes:    PERRL, conjunctiva/corneas  clear, EOM's intact, fundi     benign, both eyes   Ears:    Normal TM's and external ear canals, both ears   Nose:   Nares normal, septum midline, mucosa normal, no drainage     or sinus tenderness   Throat:   Lips, mucosa, and tongue normal; teeth and gums normal   Neck:   Supple, symmetrical, trachea midline, no adenopathy;     thyroid:  no enlargement/tenderness/nodules; no carotid    bruit or JVD   Back:     Symmetric, no curvature, ROM normal, no CVA tenderness   Lungs:     Clear to auscultation bilaterally, respirations unlabored   Chest Wall:    No tenderness or deformity    Heart:    Regular rate and rhythm, S1 and S2 normal, no murmur, rub    or gallop   Breast Exam:       Abdomen:     Soft, non-tender, bowel sounds active all four quadrants,     no masses, no organomegaly   Genitalia:  Rectal:     Extremities:   Extremities normal, atraumatic, no cyanosis or edema   Pulses:   2+ and symmetric all extremities   Skin:   Skin color, texture, turgor normal, no rashes or lesions   Lymph nodes:   Cervical, supraclavicular, and axillary nodes normal   Neurologic:   CNII-XII intact.  Mild tremor     Assessment/Plan   Hannah [name] was seen today for ov.  Bipolar 1 disorder (CMS/HCC)  Comments:  had SEs on lithium.  better  on lower dose.     check labs.     tremor improved.  Essential hypertension  Comments:  controlled. check lytes.   Depression with anxiety  Comments:  better lately  Hyperlipidemia, unspecified hyperlipidemia type  Comments:  recheck on statin  Orders:  -     Lipid panel; Future

## 2021-01-27 ENCOUNTER — Other Ambulatory Visit (INDEPENDENT_AMBULATORY_CARE_PROVIDER_SITE_OTHER)

## 2021-01-28 MED ORDER — ZOLMitriptan (Zomig) 5 mg tablet
5 | ORAL_TABLET | Freq: Once | ORAL | 3 refills | 29.00000 days | Status: AC | PRN
Start: 2021-01-28 — End: ?

## 2021-01-28 MED ORDER — omeprazole (PriLOSEC) 20 mg DR capsule
20 | ORAL_CAPSULE | Freq: Every day | ORAL | 1 refills | 60.00000 days | Status: AC
Start: 2021-01-28 — End: 2021-07-27

## 2021-01-28 NOTE — Telephone Encounter (Signed)
 No she wants it sent to [facility]  She also wants Zomig

## 2021-02-02 ENCOUNTER — Encounter (INDEPENDENT_AMBULATORY_CARE_PROVIDER_SITE_OTHER): Admitting: Family Medicine

## 2021-02-06 ENCOUNTER — Other Ambulatory Visit (INDEPENDENT_AMBULATORY_CARE_PROVIDER_SITE_OTHER): Admitting: Family Medicine

## 2021-02-19 ENCOUNTER — Encounter (INDEPENDENT_AMBULATORY_CARE_PROVIDER_SITE_OTHER): Admitting: Family Medicine

## 2021-03-27 ENCOUNTER — Encounter (HOSPITAL_BASED_OUTPATIENT_CLINIC_OR_DEPARTMENT_OTHER)

## 2021-04-06 ENCOUNTER — Encounter (INDEPENDENT_AMBULATORY_CARE_PROVIDER_SITE_OTHER): Admitting: Family Medicine

## 2021-04-20 ENCOUNTER — Other Ambulatory Visit (INDEPENDENT_AMBULATORY_CARE_PROVIDER_SITE_OTHER): Admitting: Family Medicine

## 2021-05-27 IMAGING — DX CHEST PA AND LATERAL
1 series · 2 of 2 positions shown · non-contrast
Comparison: None.

________________________________________________________________________________________________ 
CLINICAL INDICATION: Intermittent productive cough for 2 weeks

[Series 1: PA · U · 0.14mm/px · 2 of 2 slices shown]
[im 1/2]
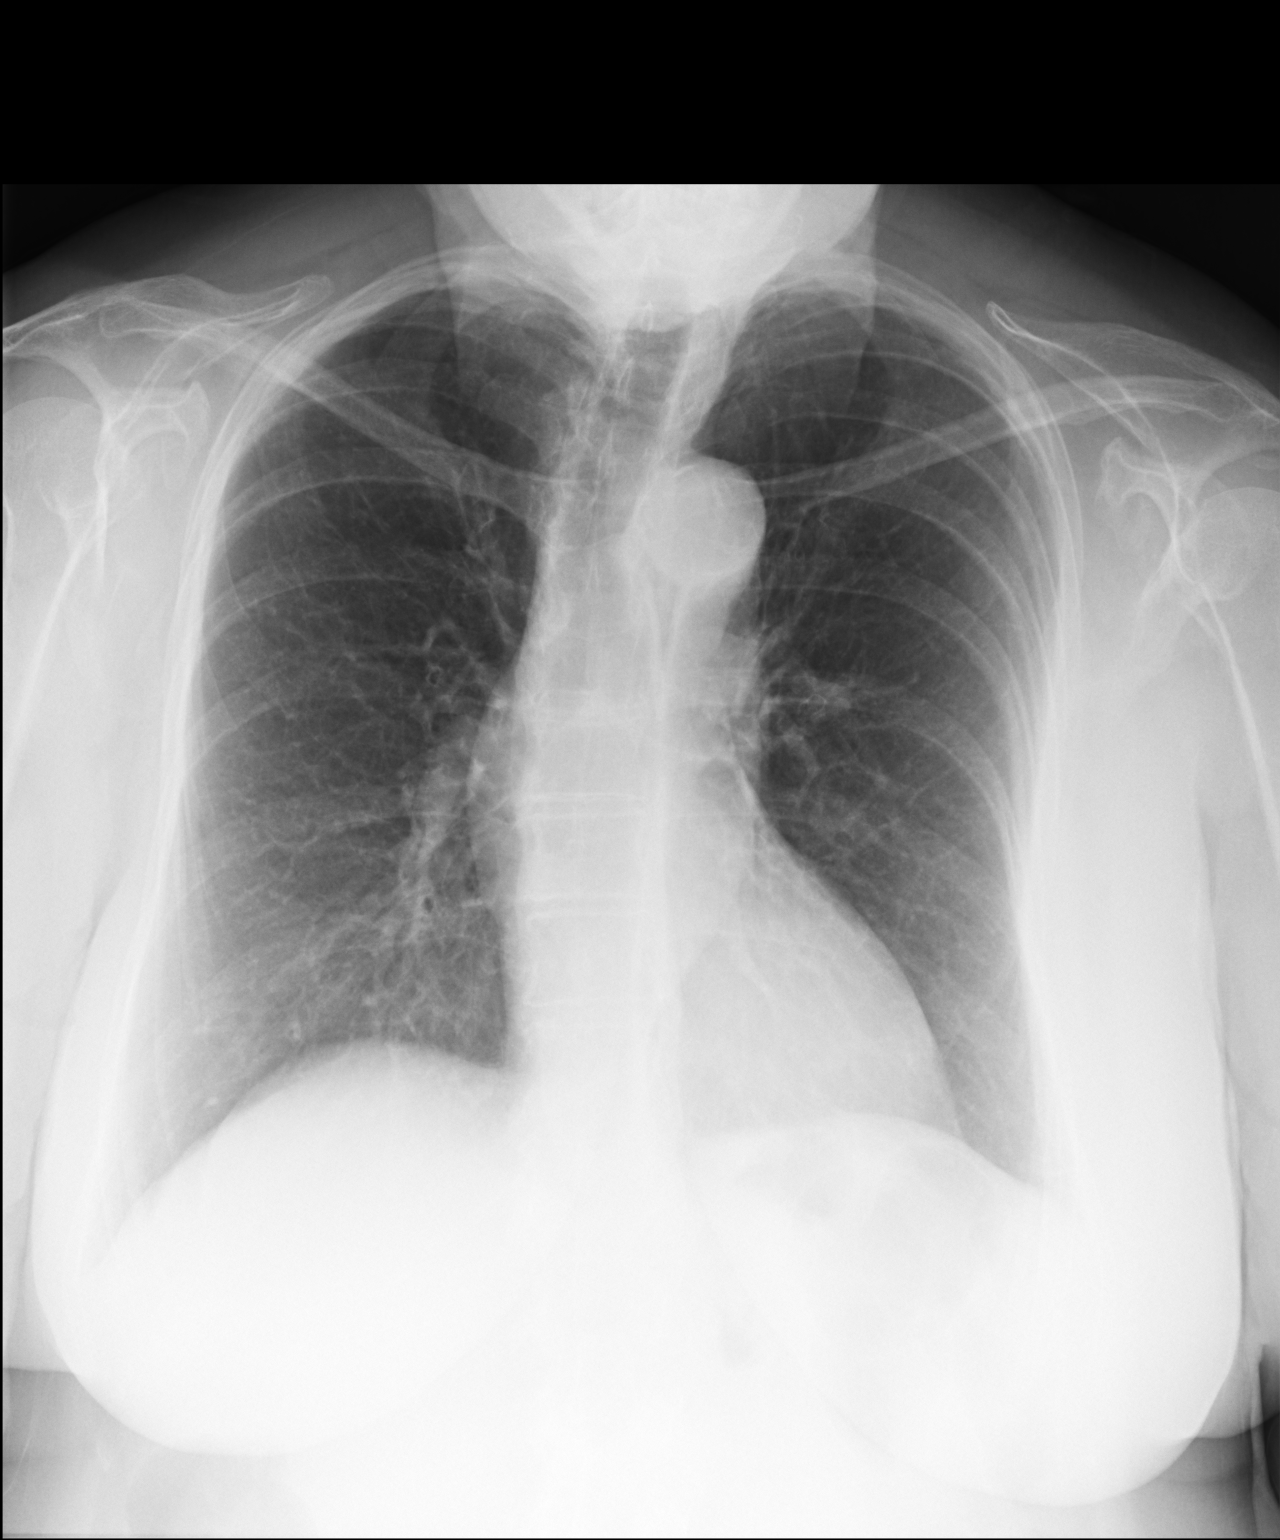
[im 2/2]
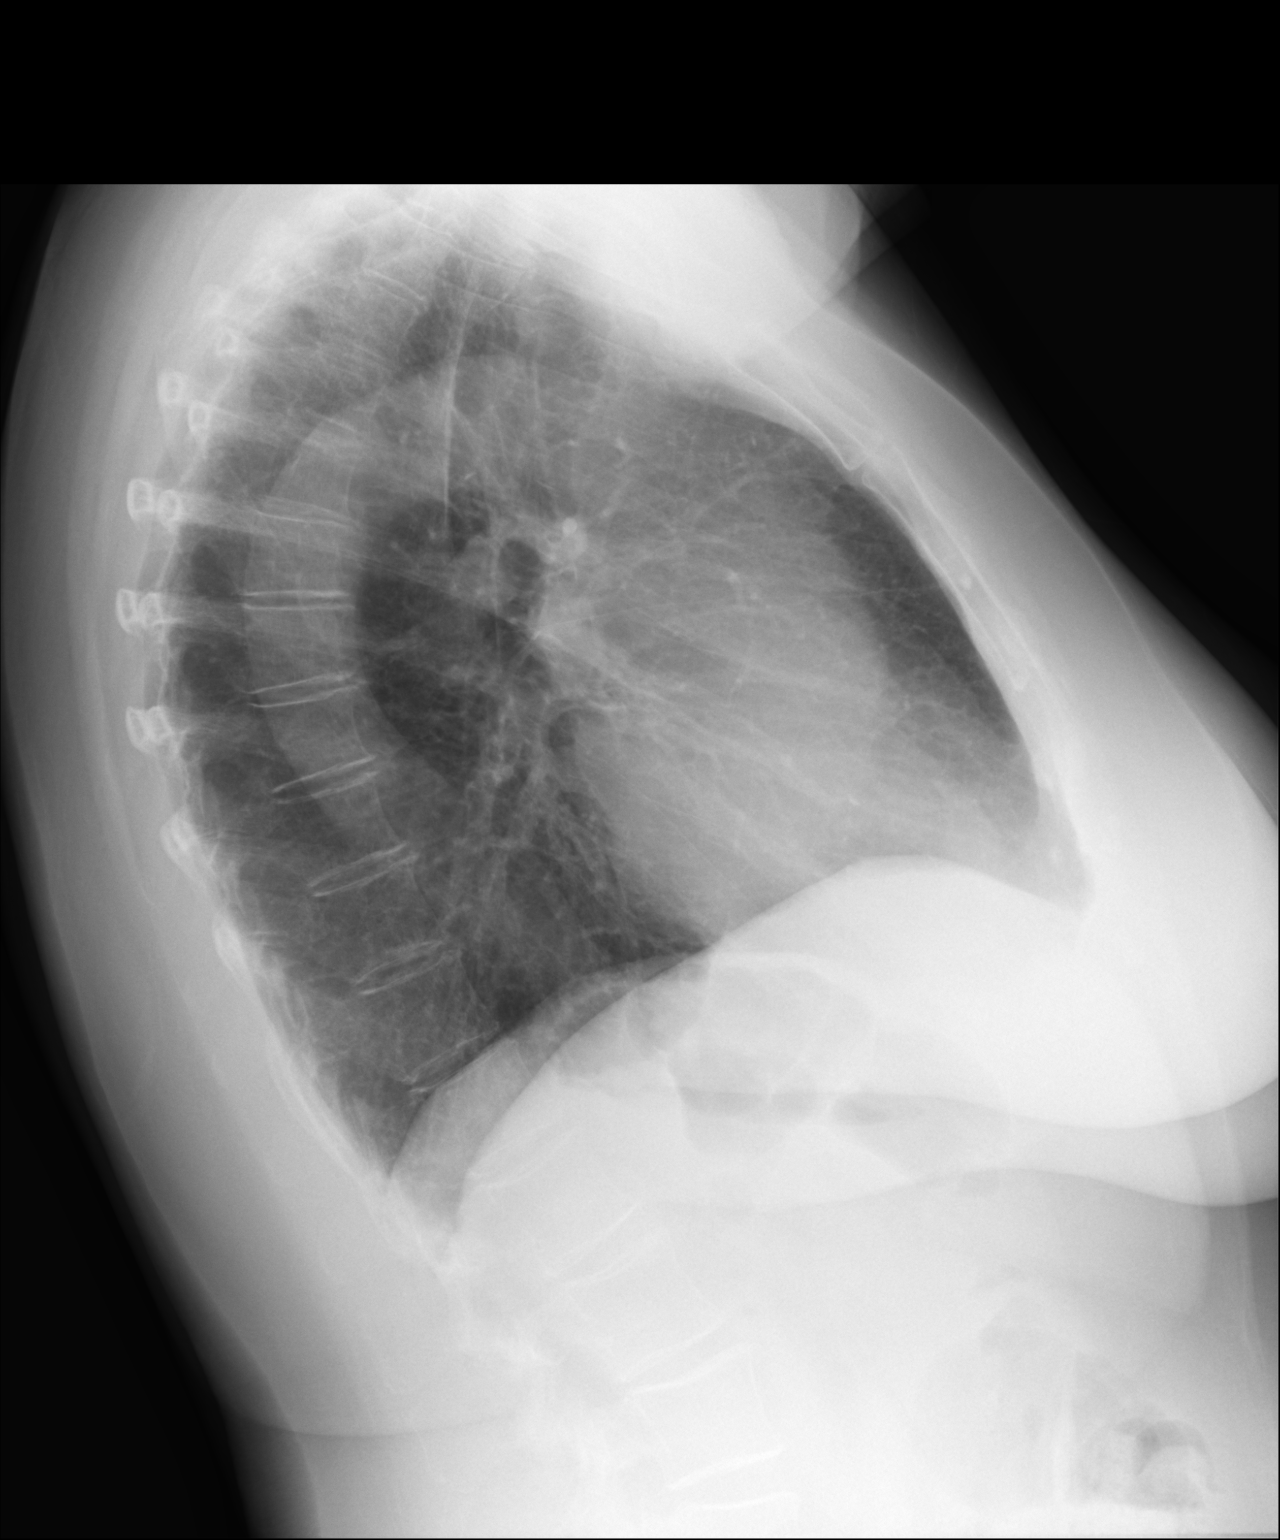

[2 of 2 positions shown; findings below may reference images not displayed]

FINDINGS: Normal heart size. Tortuous aorta. Lungs are clear and well expanded 
without focal infiltrate or consolidation. Mild bronchial wall thickening. No 
pleural effusion. Mild degenerative thoracic spondylosis.
IMPRESSION: Mild bronchitis. No pneumonia.

## 2021-06-22 ENCOUNTER — Other Ambulatory Visit (INDEPENDENT_AMBULATORY_CARE_PROVIDER_SITE_OTHER): Admitting: Family Medicine

## 2021-10-20 MED ORDER — losartan (Cozaar) 50 mg tablet
50 | ORAL_TABLET | Freq: Every day | ORAL | 1 refills | 90.00000 days | Status: AC
Start: 2021-10-20 — End: ?

## 2021-11-25 IMAGING — MG MAMMOGRAPHY SCREENING BILATERAL 3[PERSON_NAME]
8 series · 8 of 24 positions shown · non-contrast
Comparison: None.

________________________________________________________________________________________________ 
MAMMOGRAPHY SCREENING BILATERAL 3MANOLHS BELBA, 11/25/2021 [DATE]: 
CLINICAL INDICATION: Encounter for screening mammogram.
TECHNIQUE: Digital bilateral mammograms and 3-D Tomosynthesis were obtained. 
These were interpreted both primarily and with the aid of computer-aided 
detection system.  
BREAST DENSITY: (Level B) There are scattered areas of fibroglandular density.

[L CC]
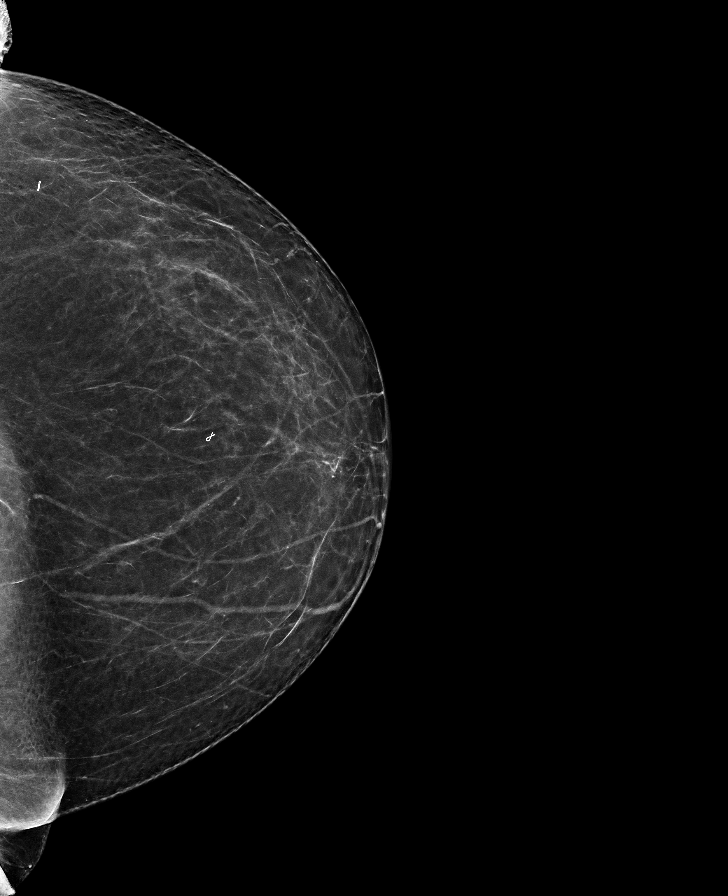

[R MLO]
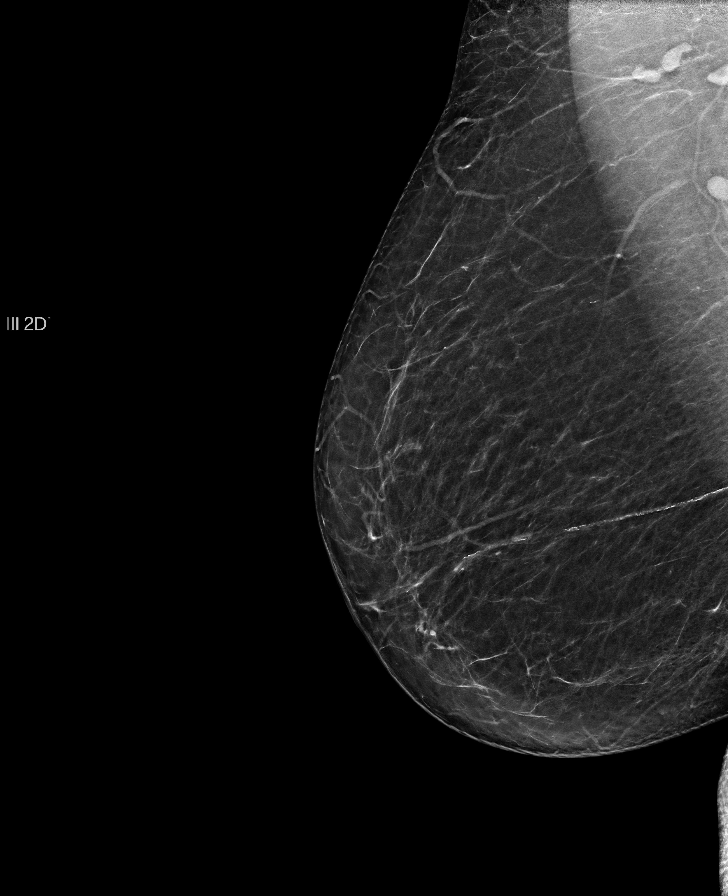

[L MLO]
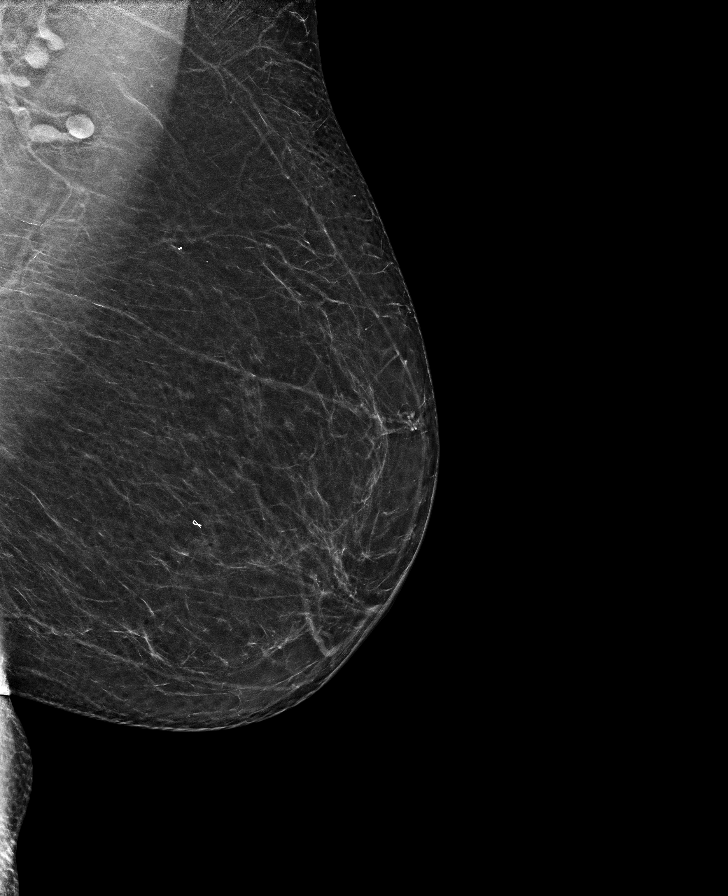

[R CC]
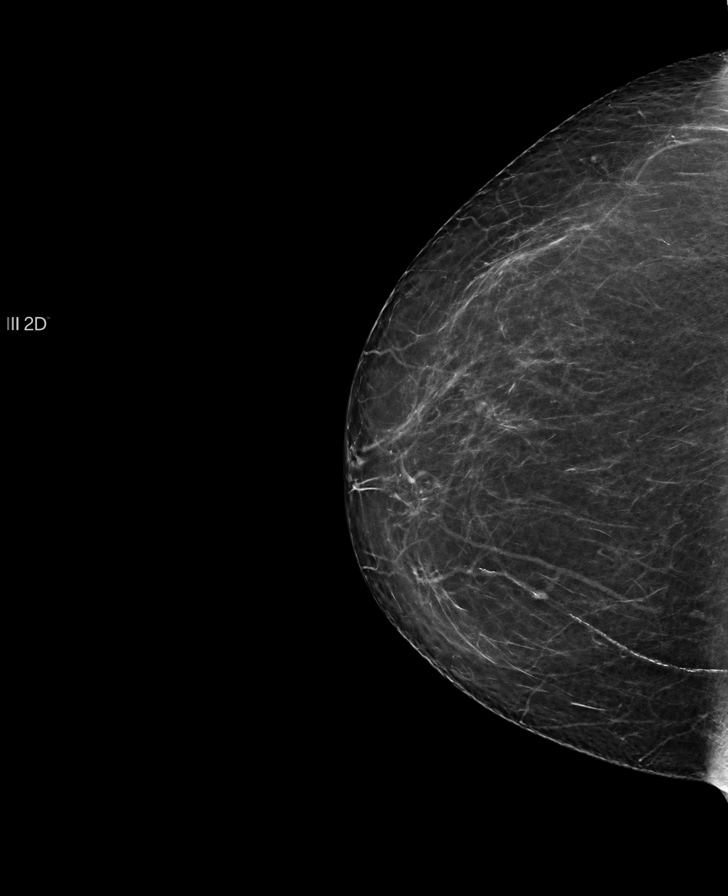

[L CC tomo · tomo slice 35/68.0]
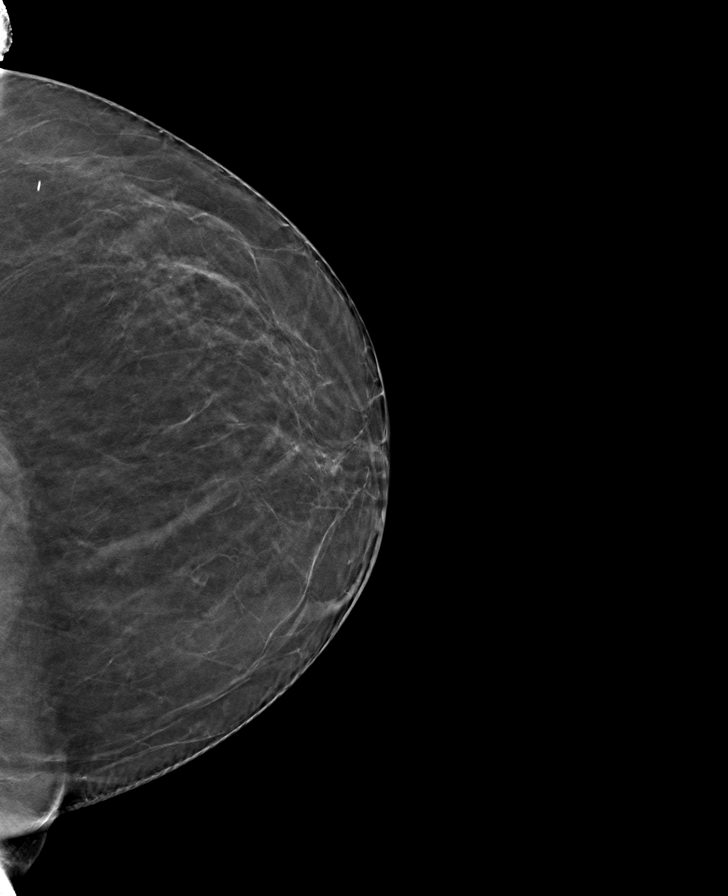

[R MLO tomo · tomo slice 32/63.0]
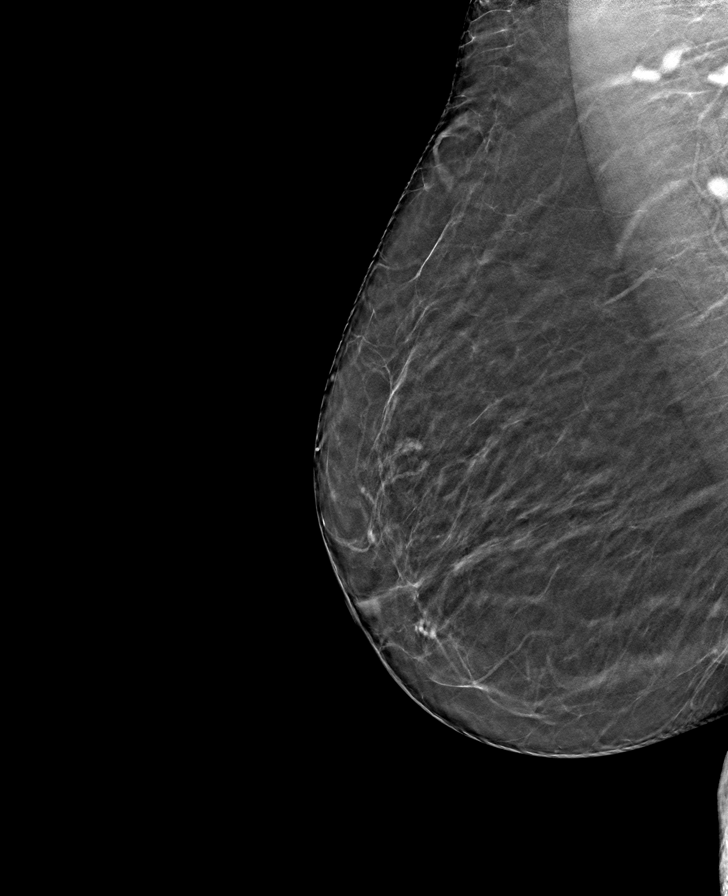

[R CC tomo · tomo slice 32/63.0]
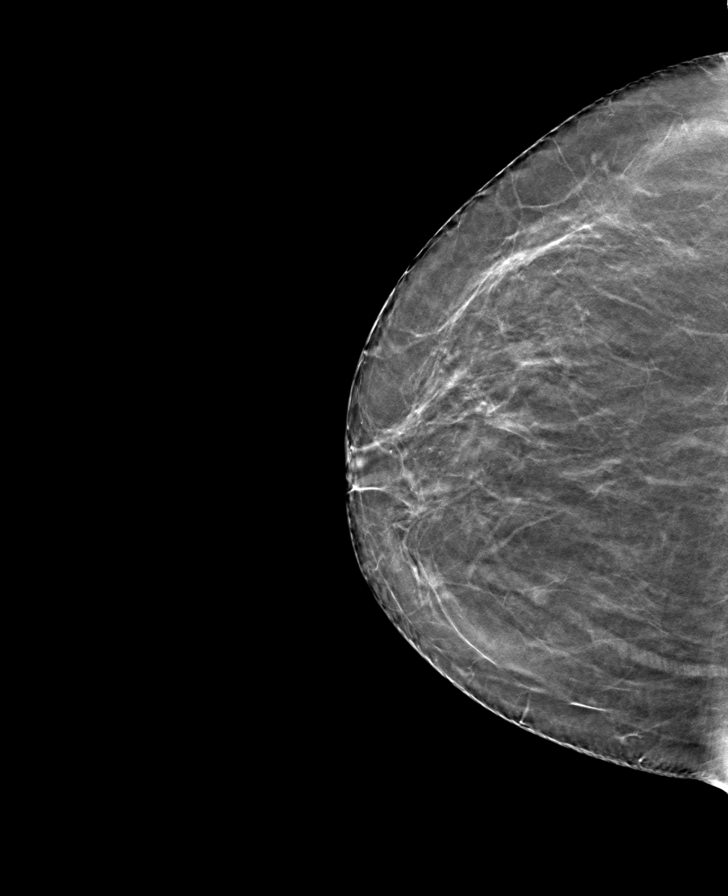

[L MLO tomo · tomo slice 34/67.0]
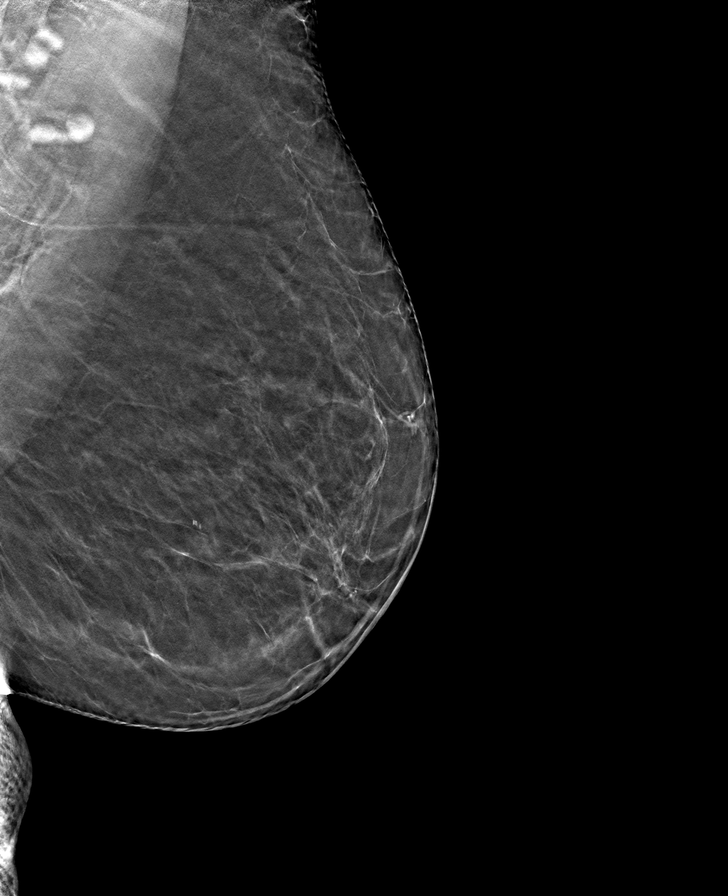

[8 of 24 positions shown; findings below may reference images not displayed]

FINDINGS: Biopsy clips left breast. Benign calcification. No suspicious mass, 
calcifications, or area of architectural distortion in either breast.
IMPRESSION: No dominant mass or suspicious calcification. 
(BI-RADS 2) Benign findings. Routine mammographic follow-up is recommended.

## 2021-12-10 ENCOUNTER — Ambulatory Visit: Admit: 2021-12-10 | Discharge: 2021-12-10 | Payer: MEDICARE | Attending: Family Medicine | Primary: Family Medicine

## 2021-12-10 ENCOUNTER — Other Ambulatory Visit: Admit: 2021-12-10 | Payer: MEDICARE | Primary: Family Medicine

## 2021-12-10 DIAGNOSIS — R5383 Other fatigue: Secondary | ICD-10-CM

## 2021-12-10 DIAGNOSIS — F418 Other specified anxiety disorders: Secondary | ICD-10-CM

## 2021-12-10 LAB — BASIC METABOLIC PANEL
Anion Gap: 5 mmol/L (ref 3–14)
BUN: 21 mg/dL (ref 6–24)
CO2 (Bicarbonate): 28 mmol/L (ref 20–32)
Calcium: 9.6 mg/dL (ref 8.5–10.5)
Chloride: 107 mmol/L (ref 98–110)
Creatinine: 1.17 mg/dL (ref 0.55–1.30)
Glucose: 81 mg/dL (ref 70–110)
Potassium: 4.3 mmol/L (ref 3.6–5.2)
Sodium: 140 mmol/L (ref 135–146)
eGFRcr: 49 mL/min/{1.73_m2} — ABNORMAL LOW (ref >=60)

## 2021-12-10 LAB — LIPID PANEL
Cholesterol: 169 mg/dL (ref <=200)
HDL cholesterol: 67 mg/dL (ref >=40)
LDL cholesterol, calculated: 75 mg/dL (ref 0–130)
Triglycerides: 136 mg/dL (ref <=150)

## 2021-12-10 LAB — HEPATIC FUNCTION PANEL
ALT: 19 U/L (ref 0–55)
AST: 26 U/L (ref 6–42)
Albumin: 3.7 g/dL (ref 3.2–5.0)
Alkaline phosphatase: 81 U/L (ref 30–130)
Bilirubin, direct: 0.1 mg/dL (ref 0.0–0.5)
Bilirubin, total: 0.5 mg/dL (ref 0.2–1.2)
Protein, total: 7.7 g/dL (ref 6.0–8.4)

## 2021-12-10 LAB — CBC WITH DIFFERENTIAL
Basophils %: 0.7 %
Basophils Absolute: 0.04 10*3/uL (ref 0.00–0.22)
Eosinophils %: 1.5 %
Eosinophils Absolute: 0.09 10*3/uL (ref 0.00–0.50)
Hematocrit: 47.3 % — ABNORMAL HIGH (ref 32.0–47.0)
Hemoglobin: 15.1 g/dL (ref 11.0–16.0)
Immature Granulocytes %: 0.3 %
Immature Granulocytes Absolute: 0.02 10*3/uL (ref 0.00–0.10)
Lymphocyte %: 35.9 %
Lymphocytes Absolute: 2.17 10*3/uL (ref 0.70–4.00)
MCH: 29.2 pg (ref 26.0–34.0)
MCHC: 31.9 g/dL (ref 31.0–37.0)
MCV: 91.5 fL (ref 80.0–100.0)
MPV: 11.6 fL (ref 9.1–12.4)
Monocytes %: 11.1 %
Monocytes Absolute: 0.67 10*3/uL (ref 0.36–0.77)
NRBC %: 0 % (ref 0.0–0.0)
NRBC Absolute: 0 10*3/uL (ref 0.00–2.00)
Neutrophil %: 50.5 %
Neutrophils Absolute: 3.06 10*3/uL (ref 1.50–7.95)
Platelets: 150 10*3/uL (ref 150–400)
RBC: 5.17 M/uL (ref 3.70–5.20)
RDW-CV: 13.2 % (ref 11.5–14.5)
RDW-SD: 44.4 fL (ref 35.0–51.0)
WBC: 6.1 10*3/uL (ref 4.0–11.0)

## 2021-12-10 LAB — TSH: TSH: 1.59 u[IU]/mL (ref 0.358–3.740)

## 2021-12-10 LAB — HEMOGLOBIN A1C
Estimated Average Glucose mg/dL (INT/EXT): 111 mg/dL
HEMOGLOBIN A1C % (INT/EXT): 5.5 % (ref <5.6)

## 2021-12-10 LAB — VITAMIN B12: Vitamin B-12: 1075 pg/mL — ABNORMAL HIGH (ref 193–986)

## 2021-12-10 MED ORDER — sertraline (Zoloft) 50 mg tablet
50 | ORAL_TABLET | Freq: Every day | ORAL | 1 refills | Status: DC
Start: 2021-12-10 — End: 2021-12-29

## 2021-12-10 NOTE — Progress Notes (Signed)
MFM FAMILY MEDICINE  [facility]  [address]  [address]  [place] [address]  Dept: [phone]  Dept Fax: [phone]     Patient ID: Hannah [name] is a 76 y.o. female who presents for physical today  feeling depressed (Increased Depakote  not motivated  difficult in getting out of bed).    Subjective   HPI   Here in follow up.   Her medications have been adjusted lately.  Started on depakote.  Now lethargic.  Sits all day and states she does nothing at all.   Sleeps a lot.  Takes seroquel at night.    No headaches.   Sinuses OK.  Breathing  OK. Sedentary.   Bowels are constipated.      No swelling of legs.   Less tremulous.   Fatigue.  No energy.  No motivation.  Some joint pains.    Hands, feet and perioral area feel cold or numb    Current Outpatient Medications   Medication Instructions   . atorvastatin (Lipitor) 20 mg tablet Take 1 tablet by mouth once daily   . CALCIUM CARBONATE ORAL 600 mg, oral, Daily RT   . cholecalciferol (Vitamin D-3) 50 MCG (2000 UT) tablet 1 tablet, oral, Daily RT   . cyanocobalamin (VITAMIN B-12) 500 mcg, oral, Daily RT   . divalproex (DEPAKOTE) 500 mg, oral, 3 times daily, Do not crush, chew, or split.    Marland Kitchen losartan (COZAAR) 50 mg, oral, Daily   . omeprazole (PRILOSEC) 20 mg, oral, Daily before breakfast, Do not crush or chew.    . sertraline (ZOLOFT) 50 mg, oral, Daily   . sertraline (ZOLOFT) 50 mg, oral, Daily   . ZOLMitriptan (ZOMIG) 5 mg, oral, Daily RT   . ZOLMitriptan (ZOMIG) 5 mg, oral, Once as needed, May repeat in 2 hours if unresolved. Do not exceed 10 mg in 24 hours.   All: listed.  Soc:  No smoking, no alcohol.   [place]   Does no exercise.    Review of Systems   Constitutional: Negative.    HENT: Positive for hearing loss.    Respiratory: Negative.    Cardiovascular: Negative.    Gastrointestinal: Negative.    Genitourinary: Negative.    Musculoskeletal: Positive for neck pain.   Skin: Negative.    Neurological:  Positive for dizziness and tremors.   Psychiatric/Behavioral: Negative for depression.     Objective   Visit Vitals  BP 122/78   Temp 37.1 C (98.7 F)   Wt 77.1 kg   BMI 33.76 kg/m   BSA 1.8 m       Physical Exam  BP 122/78   Temp 37.1 C (98.7 F)   Wt 77.1 kg   BMI 33.76 kg/m     General Appearance:    Alert, cooperative, no distress, appears stated age   Head:    Normocephalic, without obvious abnormality, atraumatic   Eyes:    PERRL, conjunctiva/corneas clear, EOM's intact, fundi     benign, both eyes   Ears:    Normal TM's and external ear canals, both ears   Nose:   Nares normal, septum midline, mucosa normal, no drainage     or sinus tenderness   Throat:   Lips, mucosa, and tongue normal; teeth and gums normal   Neck:   Supple, symmetrical, trachea midline, no adenopathy;     thyroid:  no enlargement/tenderness/nodules; no carotid    bruit or JVD  Back:     Symmetric, no curvature, ROM normal, no CVA tenderness   Lungs:     Clear to auscultation bilaterally, respirations unlabored   Chest Wall:    No tenderness or deformity    Heart:    Regular rate and rhythm, S1 and S2 normal, no murmur, rub    or gallop   Breast Exam:       Abdomen:     Soft, non-tender, bowel sounds active all four quadrants,     no masses, no organomegaly   Genitalia:     Rectal:     Extremities:   Extremities normal, atraumatic, no cyanosis or edema   Pulses:   2+ and symmetric all extremities   Skin:   Skin color, texture, turgor normal, no rashes or lesions   Lymph nodes:   Cervical, supraclavicular, and axillary nodes normal   Neurologic:   CNII-XII intact.  Mild tremor     Assessment/Plan   Hannah [name] was seen today for physical today  feeling depressed.  Depression with anxiety  Comments:  no energy,  no motivation.  does nothing all day and sleeps. check labs.  add sertraline  Orders:  -     CBC and differential - WAM and non-WAM; Future  -     Basic metabolic panel; Future  -     sertraline (Zoloft) 50 mg tablet; Take 1  tablet (50 mg) by mouth once daily.  -     sertraline (Zoloft) 50 mg tablet; Take 1 tablet (50 mg) by mouth once daily.  Other fatigue  Comments:  check labs.  encourage exercise, nutritioin, sleep  Orders:  -     Vitamin B12; Future  -     TSH; Future  -     Hemoglobin A1c; Future  Hyperlipidemia, unspecified hyperlipidemia type  Comments:  on statin  Orders:  -     Lipid panel; Future  -     Hepatic function panel; Future  Acoustic neuroma (CMS/HCC)  Comments:  hx of this.   left sided hearing loss.   can image brain if symptom are not improving  Essential hypertension  Comments:  controlled.  Chronic GERD  Comments:  on PPI long term check labs.  Bipolar 1 disorder (CMS/HCC)  Comments:  medications recently adjusted, but she is now oversedated and not functioning at all

## 2021-12-29 ENCOUNTER — Ambulatory Visit: Admit: 2021-12-29 | Discharge: 2021-12-29 | Payer: MEDICARE | Attending: Family Medicine | Primary: Family Medicine

## 2021-12-29 DIAGNOSIS — B029 Zoster without complications: Secondary | ICD-10-CM

## 2021-12-29 MED ORDER — sertraline (Zoloft) 100 mg tablet
100 | ORAL_TABLET | Freq: Every day | ORAL | 1 refills | Status: DC
Start: 2021-12-29 — End: 2022-01-28

## 2021-12-29 MED ORDER — valACYclovir (Valtrex) 1 gram tablet
1 | ORAL_TABLET | Freq: Three times a day (TID) | ORAL | 0 refills | 30.00000 days | Status: AC
Start: 2021-12-29 — End: 2022-01-05

## 2021-12-29 NOTE — Progress Notes (Signed)
MFM FAMILY MEDICINE  [facility]  [address]  [address]  [place] [id number]  Dept: [phone]  Dept Fax: [phone]     Patient ID: Mountainside ServiceJudith A [name] is a 76 y.o. female who presents for she thinks she has shingles again (Rash to stomach not painful more itchy).    Subjective   HPI   Here in follow up.   Not feeling well.   Depressed.   Has cramping of feet and ankles.    Still  lethargic.  Sits all day and states she does nothing at all.   Sleeps a lot.  Takes seroquel at night.  Feels lousy.   Has rash of flank that started the other day, itchy.     No headaches.   Sinuses OK.  Breathing  OK. Sedentary.   Bowels are constipated.      No swelling of legs.   Less tremulous.   Fatigue.  No energy.  No motivation.  Some joint pains, especially ankles.       Current Outpatient Medications   Medication Instructions   . atorvastatin (Lipitor) 20 mg tablet Take 1 tablet by mouth once daily   . CALCIUM CARBONATE ORAL 600 mg, oral, Daily RT   . cholecalciferol (Vitamin D-3) 50 MCG (2000 UT) tablet 1 tablet, oral, Daily RT   . cyanocobalamin (VITAMIN B-12) 500 mcg, oral, Daily RT   . losartan (COZAAR) 50 mg, oral, Daily   . omeprazole (PRILOSEC) 20 mg, oral, Daily before breakfast, Do not crush or chew.    Marland Kitchen. QUEtiapine XR (SEROQUEL XR) 100 mg, oral, Nightly   . sertraline (ZOLOFT) 100 mg, oral, Daily   . valACYclovir (VALTREX) 1,000 mg, oral, 3 times daily   . ZOLMitriptan (ZOMIG) 5 mg, oral, Daily RT   . ZOLMitriptan (ZOMIG) 5 mg, oral, Once as needed, May repeat in 2 hours if unresolved. Do not exceed 10 mg in 24 hours.   All: listed.  Soc:  No smoking, no alcohol.   [place]   Does no exercise.    Review of Systems   Constitutional: Negative.    HENT: Positive for hearing loss.    Respiratory: Negative.    Cardiovascular: Negative.    Gastrointestinal: Negative.    Genitourinary: Negative.    Musculoskeletal: Positive for neck pain.   Skin: Negative.    Neurological:  Positive for dizziness and tremors.   Psychiatric/Behavioral: Negative for depression.     Objective   Visit Vitals  BP 130/76   Temp 37.1 C (98.7 F)   Wt 75.8 kg   BMI 33.17 kg/m   BSA 1.78 m       Physical Exam  BP 130/76   Temp 37.1 C (98.7 F)   Wt 75.8 kg   BMI 33.17 kg/m     General Appearance:    Alert, cooperative, no distress, appears stated age   Head:    Normocephalic, without obvious abnormality, atraumatic   Eyes:    PERRL, conjunctiva/corneas clear, EOM's intact, fundi     benign, both eyes   Ears:    Normal TM's and external ear canals, both ears   Nose:   Nares normal, septum midline, mucosa normal, no drainage     or sinus tenderness   Throat:   Lips, mucosa, and tongue normal; teeth and gums normal   Neck:   Supple, symmetrical, trachea midline, no adenopathy;     thyroid:  no enlargement/tenderness/nodules; no carotid  bruit or JVD   Back:     Symmetric, no curvature, ROM normal, no CVA tenderness   Lungs:     Clear to auscultation bilaterally, respirations unlabored   Chest Wall:    No tenderness or deformity    Heart:    Regular rate and rhythm, S1 and S2 normal, no murmur, rub    or gallop   Breast Exam:       Abdomen:     Soft, non-tender, bowel sounds active all four quadrants,     no masses, no organomegaly   Genitalia:     Rectal:     Extremities:   Extremities normal, atraumatic, no cyanosis or edema   Pulses:   2+ and symmetric all extremities   Skin:   Vesicular rash of right flank, small area    Lymph nodes:   Cervical, supraclavicular, and axillary nodes normal   Neurologic:   CNII-XII intact.  Mild tremor     Assessment/Plan   Icy was seen today for she thinks she has shingles again.  Herpes zoster without complication  Comments:  tx with valtrex tid for a week.   topical measures   Orders:  -     valACYclovir (Valtrex) 1 gram tablet; Take 1 tablet (1,000 mg) by mouth three times daily for 7 days.  Depression with anxiety  Comments:  still very depressed. increase zoloft  to 100 mg daily . add exercise,  nutrition,  proper sleep  Orders:  -     sertraline (Zoloft) 100 mg tablet; Take 1 tablet (100 mg) by mouth once daily.  Acute bilateral ankle pain  Comments:  pulses intact.      started stretching, exercises

## 2021-12-30 NOTE — Telephone Encounter (Signed)
No need for pain clinic unless she is having pain from shingles,  none currently

## 2022-01-14 MED ORDER — buPROPion SR (Wellbutrin SR) 150 mg 12 hr tablet
150 | ORAL_TABLET | Freq: Two times a day (BID) | ORAL | 1 refills | Status: DC
Start: 2022-01-14 — End: 2022-02-24

## 2022-01-14 NOTE — Telephone Encounter (Signed)
The pt called in reporting that she is still very depressed even with the recent  increase of zoloft to 100 mg daily. Sh reports that she has been sleeping well, watching her diet and moving more but the Zoloft does not seem to be working for her and wants to know if you would consider changing her to a new depression medication?

## 2022-01-14 NOTE — Telephone Encounter (Signed)
Called.    Lets switch to wellbutrin  150 mg twice a day and taper off zoloft

## 2022-01-15 NOTE — Telephone Encounter (Signed)
I s/w the pt and made her aware of changes in meds and tapering on the zoloft. The pt reported understanding and reported that she did s/w Dr.Harcourt as well.

## 2022-01-21 NOTE — Telephone Encounter (Signed)
She has been incontinent of feces a few time  States "just comes out"  Very loose  Not sure if its from medication   [phone]

## 2022-01-21 NOTE — Telephone Encounter (Signed)
Called.  Has loose stools lately.  ?med related.  Will come of zoloft and see how it goes

## 2022-01-25 NOTE — Telephone Encounter (Signed)
Spoke with patient and daughter,   Since starting Wellbutrin, she has had ringing in her right ear and loose stool. Loose stool is x1 day.  Decreased appetite.   Advised patient to continue with dosage ordered for Wellbutrin.     No other symptoms, negative for nausea or pain but would like to be assessed.     Scheduled with AA 01/27/22

## 2022-01-27 ENCOUNTER — Ambulatory Visit: Admit: 2022-01-27 | Discharge: 2022-01-27 | Payer: MEDICARE | Attending: Physician Assistant | Primary: Family Medicine

## 2022-01-27 DIAGNOSIS — H9313 Tinnitus, bilateral: Secondary | ICD-10-CM

## 2022-01-27 NOTE — Progress Notes (Signed)
MFM FAMILY MEDICINE  [facility]  [address]  [address]  [place] [address]  Dept: [phone]  Dept Fax: [phone]     Patient ID: Hannah [name] is a 76 y.o. 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 Pt reports that everything sounds muffled x1 week. ).    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  Recently started Wellbutrin about 2-3 wks ago-in last 1-2 weeks has noticed decreased hearing in R ear and tinnitus.  She is concerned about this being caused by Wellbutrin and would like to stop med and switch back to Paxil-states she was on this in past and worked for many years. She did speak to the pharmacist and was told this can be s/e of meds.  Notes her dtr came last night to try and clean out her ear, used a solution and squirt bottle, nothing really came out. Her other Dtr used a device that vacuums out ears and did this the following day. No changes  Denies h/a, dizziness, gait changes, vision changes      Objective   Visit Vitals  BP 122/74   Pulse 83   Wt 75.3 kg Comment: 166 lbs   SpO2 98%   BMI 32.97 kg/m   BSA 1.78 m       Physical Exam  Constitutional:       Appearance: Normal appearance.   HENT:      Head: Normocephalic.      Left Ear: Tympanic membrane normal.      Ears:      Comments: R ear-EAC-superficial abrasion and dried blood in canal, TM intact, pearly, good reflex, no redness, bulging     Mouth/Throat:      Mouth: Mucous membranes are moist.   Eyes:      Extraocular Movements: Extraocular movements intact.      Conjunctiva/sclera: Conjunctivae normal.   Cardiovascular:      Rate and Rhythm: Normal rate.   Pulmonary:      Effort: Pulmonary effort is normal.   Musculoskeletal:      Cervical back: Normal range of motion.   Neurological:      Mental  Status: She is alert and oriented to person, place, and time. Mental status is at baseline.   Psychiatric:         Attention and Perception: Attention and perception normal.         Mood and Affect: Affect is tearful.         Speech: Speech normal.         Assessment/Plan   Hannah [name] was seen today for bilateral ringing in ears.  Tinnitus of both ears  Decreased hearing of right ear  History of acoustic neuroma  Depression with anxiety    Could check CT-pt would like to come off Wellbutrin and restart Paxil-discussed this and will review with PCP prior to restart  Also discussed ENT referral given hx of neuroma and development of sx in R ear. She is concerned as she feels this was way her L ear started as well.  Explained tinnitus is s/e of Wellbutrin and she can stop this. Paxil can be sent and possible CT of head done if tinnitus does  not resolve.

## 2022-01-27 NOTE — Addendum Note (Signed)
Addended by: Zelphia CairoABOSHAR, Alixis Harmon on: 02/01/2022 01:38 PM     Modules accepted: Orders

## 2022-01-28 MED ORDER — PARoxetine (Paxil) 20 mg tablet
20 | ORAL_TABLET | Freq: Every morning | ORAL | 1 refills | Status: DC
Start: 2022-01-28 — End: 2022-03-15

## 2022-01-28 MED ORDER — predniSONE (Deltasone) 50 mg tablet
50 | ORAL_TABLET | Freq: Every day | ORAL | 0 refills | Status: DC
Start: 2022-01-28 — End: 2022-01-29

## 2022-01-28 NOTE — Progress Notes (Signed)
Daughter notes sudden hearing loss recently.    Hx of left ear deafness.   Lets start prednisone,   Refer to ENT.  Add flonase.     Believes it may be due to wellbutrin.  Lets stop this and switch to paxil

## 2022-01-29 ENCOUNTER — Ambulatory Visit: Admit: 2022-01-29 | Discharge: 2022-01-29 | Payer: MEDICARE | Attending: Otolaryngology | Primary: Family Medicine

## 2022-01-29 DIAGNOSIS — H90A21 Sensorineural hearing loss, unilateral, right ear, with restricted hearing on the contralateral side: Secondary | ICD-10-CM

## 2022-01-29 MED ORDER — predniSONE 10 mg tablets (dose pack)
10 | ORAL_TABLET | ORAL | 0 refills | Status: DC
Start: 2022-01-29 — End: 2022-01-29

## 2022-01-29 MED ORDER — predniSONE 10 mg tablets (dose pack)
10 | ORAL_TABLET | ORAL | 0 refills | Status: AC
Start: 2022-01-29 — End: 2022-02-09

## 2022-01-29 NOTE — Progress Notes (Signed)
[facility]  [facility]  [address]  [place] [address]  Dept: [phone]  Dept Fax: [phone]     Chief Complaint: Hannah [name] is a 76 y.o. female who presents for evaluation of hearing loss.     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 She hears a sound in the left ear when moving her eyes. Two weeks ago she started wellbutrin and suddenly noticed a white noise sound in both ears. The right ear did sound muffled at that time which was a sudden change.    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   Patient's hearing is decreased compared to the previous audiogram.     Past Medical History:   Diagnosis Date   . Acoustic neuroma (CMS/HCC)    . Chronic GERD    . Depression with anxiety    . Essential hypertension    . Family hx of colon cancer    . Hyperlipidemia    . Lichen sclerosus    . Osteopenia determined by x-ray    . Vitamin D deficiency         Past Surgical History:   Procedure Laterality Date   . ADENOIDECTOMY     . APPENDECTOMY     . RECTAL SURGERY  1973    rectal fissurectomies   . TONSILLECTOMY          Family History   Problem Relation Name Age of Onset   . Colon cancer Mother          Social History     Socioeconomic History   . Marital status: Married     Spouse name: Not on file   . Number of children: Not on file   . Years of education: Not on file   . Highest education level: Not on file   Occupational History   . Not on file   Tobacco Use   . Smoking status: Never   . Smokeless tobacco: Never   Vaping Use   . Vaping Use: Never used   Substance and Sexual Activity   . Alcohol use: Never   . Drug use: Never   . Sexual activity: Defer   Other Topics Concern   . Not on file   Social History Narrative   . Not on file     Social  Determinants of Health     Financial Resource Strain: Not on file   Food Insecurity: Not on file   Transportation Needs: Not on file   Physical Activity: Not on file   Stress: Not on file   Social Connections: Not on file   Intimate Partner Violence: Not on file   Housing Stability: Not on file        Current Outpatient Medications   Medication Sig Dispense Refill   . atorvastatin (Lipitor) 20 mg tablet Take 1 tablet by mouth once daily 90 tablet 1   . buPROPion SR (Wellbutrin SR) 150 mg 12 hr tablet Take 1 tablet (150 mg) by mouth twice daily. Do not crush, chew, or split. 60 tablet 1   . CALCIUM CARBONATE ORAL Take 600 mg by mouth in the morning.     . cholecalciferol (Vitamin D-3) 50 MCG (2000 UT) tablet Take 1 tablet by mouth  in the morning.     . cyanocobalamin (Vitamin B-12) 500 mcg tablet Take 500 mcg by mouth in the morning.     Marland Kitchen losartan (Cozaar) 50 mg tablet Take 1 tablet (50 mg) by mouth once daily. 90 tablet 1   . omeprazole (PriLOSEC) 20 mg DR capsule Take 1 capsule (20 mg) by mouth before breakfast. Do not crush or chew. 90 capsule 1   . PARoxetine (Paxil) 20 mg tablet Take 1 tablet (20 mg) by mouth in the morning. 30 tablet 1   . QUEtiapine XR (SEROquel XR) 50 mg 24 hr tablet Take 100 mg by mouth at bedtime.     Marland Kitchen ZOLMitriptan (Zomig) 5 mg tablet Take 5 mg by mouth in the morning.     Marland Kitchen ZOLMitriptan (Zomig) 5 mg tablet Take 1 tablet (5 mg) by mouth 1 (one) time if needed for migraine. May repeat in 2 hours if unresolved. Do not exceed 10 mg in 24 hours. 6 tablet 3     No current facility-administered medications for this visit.        Allergies   Allergen Reactions   . Codeine    . Eucalyptus    . Sulfa (Sulfonamide Antibiotics) Rash        Review of Systems:  Pertinent positives and HEENT review of systems noted in HPI.    Objective   There were no vitals taken for this visit.    ENT Physical Exam    Constitutional:  General Appearance: No apparent distress, alert.   Orbits  Extraocular movements  are intact, no spontaneous nystagmus.   Ear:  Right: Pinna without lesions. External auditory canal is clear without edema or erythema. The tympanic membrane is intact without evidence of middle ear effusion.   Left: Pinna without lesions. External auditory canal is clear without edema or erythema. The tympanic membrane is intact without evidence of middle ear effusion.   Facial nerve: Facial movement is intact and symmetric.   Nose: Speculum exam: Anterior nasal cavity is clear. The caudal septal mucosa is normal. The anterior face of the bilateral inferior turbinates is unremarkable. No obvious purulence or polyps.  Oral cavity/Oropharynx: Lips: No lesions. Floor of mouth: without lesions. Tongue: symmetric movement, no masses. Palate: no masses, symmetric elevation, uvula midline. Oropharynx:  no erythema or exudate.  No masses or lesions.   Posterior oropharyngeal wall without mass or lesion.  Normal mucosa.   Larynx/Hypopharynx: voice is normal.  Neck: Thyroid: No palpable nodules. Palpation: No masses, adenopathy.   Skin of Head/Neck: No obvious lesions.   Cranial Nerve assessment:  Nerves 3 - 12: symmetric bilaterally.      Assessment/Plan   Hannah [name] was seen today for hearing problem.  Sensorineural hearing loss (SNHL) of right ear with restricted hearing of left ear  -     predniSONE 10 mg tablets (dose pack); Take 6 tablets (60 mg) by mouth once daily for 7 days, THEN 5 tablets (50 mg) once daily for 1 day, THEN 4 tablets (40 mg) once daily for 1 day, THEN 3 tablets (30 mg) once daily for 1 day, THEN 2 tablets (20 mg) once daily for 1 day, THEN 1 tablet (10 mg) once daily for 1 day.  Abnormal auditory perception, bilateral  Other orders  -     Auditory function tests    She has a history of left-sided profound hearing loss with a change in hearing in the right ear which seems somewhat sudden. Due to concern for  sudden onset sensorineural hearing loss (SOSNHL) in the only hearing ear, high dose oral  prednisone prescribed. We did review the risk of oral steroids. Patient agreed to proceed.     I spent a total of 45 minutes reviewing the patient's chart, speaking with the patient, coordinating care and documenting in the medical records.     Harahan [name] feels comfortable with the plan as outlined above. All questions and concerns were addressed in full detail. I will be contacted if there are any persistent, worsening, recurrent or new problems or concerns. Potential worrisome issues and symptoms reviewed. It was a pleasure meeting with Hannah [name] today.

## 2022-01-29 NOTE — Telephone Encounter (Signed)
Pts daughter called pt has sudden hearing loss in her right ear and its the only ear she was able to hear from. They did see the PCP and they recommended she come in to see an ENT.

## 2022-01-29 NOTE — Telephone Encounter (Signed)
Made pts daughter aware and booked for audio first since it was the only slot available for a hearing teat.

## 2022-02-04 ENCOUNTER — Inpatient Hospital Stay: Admit: 2022-02-04 | Payer: MEDICARE | Primary: Family Medicine

## 2022-02-04 DIAGNOSIS — H9313 Tinnitus, bilateral: Secondary | ICD-10-CM

## 2022-02-04 DIAGNOSIS — Z86018 Personal history of other benign neoplasm: Secondary | ICD-10-CM

## 2022-02-10 ENCOUNTER — Encounter: Payer: MEDICARE | Attending: Otolaryngology | Primary: Family Medicine

## 2022-02-10 NOTE — Telephone Encounter (Signed)
Pt aware

## 2022-02-10 NOTE — Telephone Encounter (Signed)
Pt called to advise CT she had yesterday was negative. Pt is requesting that results be sent to [facility], [address] [place]. She has an appointment tomorrow with Dr. Maude Leriche at 9:15am. Called ENT office for a fax number but they were closed and no fax is listed on their web site. Called pt to advise.   Will call office in the morning and fax asap

## 2022-02-10 NOTE — Telephone Encounter (Signed)
Het head CT is neg for R sided acoustic neuroma or mass. I would advised she f/u with her specialist for this for further imaging or possible MRI. Thanks.

## 2022-02-11 ENCOUNTER — Ambulatory Visit: Admit: 2022-02-11 | Discharge: 2022-02-11 | Payer: MEDICARE | Attending: Otolaryngology | Primary: Family Medicine

## 2022-02-11 DIAGNOSIS — H90A21 Sensorineural hearing loss, unilateral, right ear, with restricted hearing on the contralateral side: Secondary | ICD-10-CM

## 2022-02-11 NOTE — Progress Notes (Signed)
[facility]  [facility]  [address]  [place] [address]  Dept: [phone]  Dept Fax: [phone]     Chief Complaint: Hannah [name] is a 76 y.o. female who presents for evaluation of hearing loss.     Subjective     She took high dose prednisone for 2 weeks and denies any improvement in symptoms. She brought in her audiogram from 08/22/20 in [place] which shows slightly better hearing in the right ear.     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 She hears a sound in the left ear when moving her eyes. Two weeks ago she started wellbutrin and suddenly noticed a white noise sound in both ears. The right ear did sound muffled at that time which was a sudden change.    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   Patient's hearing is decreased compared to the previous audiogram.     Past Medical History:   Diagnosis Date   . Acoustic neuroma (CMS/HCC)    . Chronic GERD    . Depression with anxiety    . Essential hypertension    . Family hx of colon cancer    . Hyperlipidemia    . Lichen sclerosus    . Osteopenia determined by x-ray    . Vitamin D deficiency         Past Surgical History:   Procedure Laterality Date   . ADENOIDECTOMY     . APPENDECTOMY     . RECTAL SURGERY  1973    rectal fissurectomies   . TONSILLECTOMY          Family History   Problem Relation Name Age of Onset   . Colon cancer Mother          Social History     Socioeconomic History   . Marital status: Married     Spouse name: Not on file   . Number of children: Not on file   . Years of education: Not on file   . Highest education level: Not on file   Occupational History   . Not on file   Tobacco Use   . Smoking status: Never   . Smokeless tobacco: Never   Vaping Use   . Vaping  Use: Never used   Substance and Sexual Activity   . Alcohol use: Never   . Drug use: Never   . Sexual activity: Defer   Other Topics Concern   . Not on file   Social History Narrative   . Not on file     Social Determinants of Health     Financial Resource Strain: Not on file   Food Insecurity: Not on file   Transportation Needs: Not on file   Physical Activity: Not on file   Stress: Not on file   Social Connections: Not on file   Intimate Partner Violence: Not on file   Housing Stability: Not on file        Current Outpatient Medications   Medication Sig Dispense Refill   . atorvastatin (Lipitor) 20 mg tablet Take 1 tablet by mouth once daily 90 tablet 1   . buPROPion SR (Wellbutrin SR) 150 mg 12 hr tablet Take 1 tablet (150 mg) by mouth twice daily. Do  not crush, chew, or split. 60 tablet 1   . CALCIUM CARBONATE ORAL Take 600 mg by mouth in the morning.     . cholecalciferol (Vitamin D-3) 50 MCG (2000 UT) tablet Take 1 tablet by mouth in the morning.     . cyanocobalamin (Vitamin B-12) 500 mcg tablet Take 500 mcg by mouth in the morning.     Marland Kitchen losartan (Cozaar) 50 mg tablet Take 1 tablet (50 mg) by mouth once daily. 90 tablet 1   . omeprazole (PriLOSEC) 20 mg DR capsule Take 1 capsule (20 mg) by mouth before breakfast. Do not crush or chew. 90 capsule 1   . PARoxetine (Paxil) 20 mg tablet Take 1 tablet (20 mg) by mouth in the morning. 30 tablet 1   . QUEtiapine XR (SEROquel XR) 50 mg 24 hr tablet Take 100 mg by mouth at bedtime.     Marland Kitchen ZOLMitriptan (Zomig) 5 mg tablet Take 5 mg by mouth in the morning.     Marland Kitchen ZOLMitriptan (Zomig) 5 mg tablet Take 1 tablet (5 mg) by mouth 1 (one) time if needed for migraine. May repeat in 2 hours if unresolved. Do not exceed 10 mg in 24 hours. 6 tablet 3     No current facility-administered medications for this visit.        Allergies   Allergen Reactions   . Codeine    . Eucalyptus    . Sulfa (Sulfonamide Antibiotics) Rash        Review of Systems:  Pertinent positives and HEENT  review of systems noted in HPI.    Objective   There were no vitals taken for this visit.    ENT Physical Exam    Constitutional:  General Appearance: No apparent distress, alert.   Orbits  Extraocular movements are intact, no spontaneous nystagmus.   Ear:  Right: Pinna without lesions. External auditory canal is clear without edema or erythema. The tympanic membrane is intact without evidence of middle ear effusion.   Left: Pinna without lesions. External auditory canal is clear without edema or erythema. The tympanic membrane is intact without evidence of middle ear effusion.   Facial nerve: Facial movement is intact and symmetric.   Nose: Speculum exam: Anterior nasal cavity is clear. The caudal septal mucosa is normal. The anterior face of the bilateral inferior turbinates is unremarkable. No obvious purulence or polyps.  Oral cavity/Oropharynx: Lips: No lesions. Floor of mouth: without lesions. Tongue: symmetric movement, no masses. Palate: no masses, symmetric elevation, uvula midline. Oropharynx:  no erythema or exudate.  No masses or lesions.   Posterior oropharyngeal wall without mass or lesion.  Normal mucosa.   Larynx/Hypopharynx: voice is normal.  Neck: Thyroid: No palpable nodules. Palpation: No masses, adenopathy.   Skin of Head/Neck: No obvious lesions.   Cranial Nerve assessment:  Nerves 3 - 12: symmetric bilaterally.      Assessment/Plan   Hannah [name] was seen today for hearing loss.  Sensorineural hearing loss (SNHL) of right ear with restricted hearing of left ear    Her symptoms are most consistent with normal age related hearing decline in the right ear after comparing her audiogram from [place] in 07/2020 to 01/2022. No indication for transtympanic steroid injections for presumed sudden hearing loss. She will meet with audiology to service her existing hearing aids. I will see her back in 1 year.     I spent a total of 45 minutes reviewing the patient's chart, speaking with the patient, coordinating  care  and documenting in the medical records.     [facility] [facility] feels comfortable with the plan as outlined above. All questions and concerns were addressed in full detail. I will be contacted if there are any persistent, worsening, recurrent or new problems or concerns. Potential worrisome issues and symptoms reviewed. It was a pleasure meeting with [facility] today.

## 2022-02-11 NOTE — Telephone Encounter (Signed)
Called [facility] for fax number. Copy of patients CT was faxed to [phone]

## 2022-02-12 NOTE — Telephone Encounter (Signed)
NOted, thank you for follow through.

## 2022-02-22 ENCOUNTER — Encounter: Payer: MEDICARE | Attending: Family Medicine | Primary: Family Medicine

## 2022-02-24 NOTE — Telephone Encounter (Signed)
Called.  OK to increase to 40 mg per day

## 2022-02-24 NOTE — Telephone Encounter (Signed)
She is taking Paxil 20 mg and feels that its not doing anything  Can she increase the dose?

## 2022-03-04 MED ORDER — PARoxetine (Paxil) 40 mg tablet
40 | ORAL_TABLET | Freq: Every morning | ORAL | 1 refills | Status: DC
Start: 2022-03-04 — End: 2022-04-22

## 2022-03-04 NOTE — Telephone Encounter (Signed)
Sent rx for paxil to pharmacy

## 2022-03-04 NOTE — Telephone Encounter (Signed)
Pt called in stating needs new Rx for Paxil. Pt states was told by PCP she can double the dose and now is running low. Pt states pharmacy needs a new Rx of 40Mg  do to Rx not able to be filled due to 20mg  dose.

## 2022-03-15 ENCOUNTER — Ambulatory Visit: Admit: 2022-03-15 | Discharge: 2022-03-15 | Payer: MEDICARE | Attending: Family Medicine | Primary: Family Medicine

## 2022-03-15 DIAGNOSIS — F418 Other specified anxiety disorders: Secondary | ICD-10-CM

## 2022-03-15 MED ORDER — ARIPiprazole (Abilify) 2 mg tablet
2 | ORAL_TABLET | Freq: Every day | ORAL | 1 refills | Status: DC
Start: 2022-03-15 — End: 2022-04-30

## 2022-03-15 NOTE — Progress Notes (Signed)
[facility]  [facility]  [address]  [address]  [place] [address]  Dept: [phone]  Dept Fax: [phone]     Patient ID: Hannah [name] is a 76 y.o. female who presents for follow-up  still very depressed (Feeles celexa not working  ---).    Subjective   HPI   Here in follow up.   Has been depressed all summer.   Sits all day and doesn't leave the house.  No motivation.  Still thinking clearly, but unable to get out and live her live.   Looks forward to sleeping at night.   seroquel helps her sleep.  paxil has not been effective.  Has been on numerous antidepressants in the past.    No headaches.   Sinuses OK.  Breathing  OK. Sedentary.   Bowels are constipated.      No swelling of legs.   Less tremulous.   Fatigue.  No energy.  No motivation.  Some joint pains.      Current Outpatient Medications   Medication Instructions   . ARIPiprazole (ABILIFY) 2 mg, oral, Daily   . atorvastatin (Lipitor) 20 mg tablet Take 1 tablet by mouth once daily   . CALCIUM CARBONATE ORAL 600 mg, oral, Daily RT   . cholecalciferol (Vitamin D-3) 50 MCG (2000 UT) tablet 1 tablet, oral, Daily RT   . cyanocobalamin (VITAMIN B-12) 500 mcg, oral, Daily RT   . losartan (COZAAR) 50 mg, oral, Daily   . omeprazole (PRILOSEC) 20 mg, oral, Daily before breakfast, Do not crush or chew.    Marland Kitchen PARoxetine (PAXIL) 40 mg, oral, Every morning   . QUEtiapine XR (SEROQUEL XR) 100 mg, oral, Nightly   . ZOLMitriptan (ZOMIG) 5 mg, oral, Once as needed, May repeat in 2 hours if unresolved. Do not exceed 10 mg in 24 hours.   All: listed.  Soc:  No smoking, no alcohol.   [place] in [place]   Does no exercise.    Review of Systems   Constitutional: Negative.    HENT: Positive for hearing loss.    Respiratory: Negative.    Cardiovascular: Negative.    Gastrointestinal: Negative.    Genitourinary: Negative.    Musculoskeletal: Positive for neck pain.   Skin: Negative.    Neurological: Positive for dizziness and  tremors.   Psychiatric/Behavioral: Negative for depression.     Objective   Visit Vitals  BP 122/80   Temp 37.1 C (98.7 F)   Wt 73.5 kg   BMI 32.17 kg/m   BSA 1.76 m       Physical Exam  BP 122/80   Temp 37.1 C (98.7 F)   Wt 73.5 kg   BMI 32.17 kg/m     General Appearance:    Alert, cooperative, no distress,  Coherent and intelligent   Head:    Normocephalic, without obvious abnormality, atraumatic   Eyes:    PERRL, conjunctiva/corneas clear, EOM's intact, fundi     benign, both eyes   Ears:    Normal TM's and external ear canals, both ears   Nose:   Nares normal, septum midline, mucosa normal, no drainage     or sinus tenderness   Throat:   Lips, mucosa, and tongue normal; teeth and gums normal   Neck:   Supple, symmetrical, trachea midline, no adenopathy;     thyroid:  no enlargement/tenderness/nodules; no carotid    bruit or JVD   Back:  Symmetric, no curvature, ROM normal, no CVA tenderness   Lungs:     Clear to auscultation bilaterally, respirations unlabored   Chest Wall:    No tenderness or deformity    Heart:    Regular rate and rhythm, S1 and S2 normal, no murmur, rub    or gallop   Breast Exam:       Abdomen:     Soft, non-tender, bowel sounds active all four quadrants,     no masses, no organomegaly   Genitalia:     Rectal:     Extremities:   Extremities normal, atraumatic, no cyanosis or edema   Pulses:   2+ and symmetric all extremities   Skin:   Skin color, texture, turgor normal, no rashes or lesions   Lymph nodes:   Cervical, supraclavicular, and axillary nodes normal   Neurologic:   CNII-XII intact.  Mild tremor     Assessment/Plan   Hannah [name] was seen today for follow-up  still very depressed.  Depression with anxiety  Comments:  resistent depression.  has not responded to paxil.  add abilify.  encouarge nutrition, sleep, exercise, socialization.  refer to psychiatrist.   Orders:  -     ARIPiprazole (Abilify) 2 mg tablet; Take 1 tablet (2 mg) by mouth once daily.  -     Referral to Care  Management/Behavioral Health; Future    Lab testing fine.  Other problems stable.

## 2022-03-18 NOTE — Progress Notes (Signed)
This BHSS called outbound to pt.   RE: BHIP referral sent by PCP Depression/med management - failed several antidepressants, needs psychiatrist for med management  called on 8/21 LM, 8/23 LM/text, 8/24 text  Sent a Tiger text to  Dr. Gwendel Hanson  before closing to see if the practice can reach out and get her to respond.    Will remain open per pt needs assistance

## 2022-03-23 NOTE — Telephone Encounter (Signed)
Glad to hear it

## 2022-03-24 NOTE — Progress Notes (Signed)
This BHSS called outbound to pt.   RE: If pt needs further supports at this time  Or if she is all set.   Will close program per BHSS   LM for pt. Today 9/13 to see if they are in need of   any resources

## 2022-03-24 NOTE — Progress Notes (Addendum)
[place] the mall went to restroom, she is currently abilify 2 grams  and Paxil 40Mg  generic form for both and it works per pt. Pt. feels so much, pt. okayed being referred to Dr. Juanetta GoslingKattula, and sent psychiatry info through text per pt. [place] [facility] [place] but will be back in november and december as well, they go back and forth per pt.     Sent referral to [facility] also sent   Psychiatric referral for Dr. Juanetta GoslingKattula and sent additional list of psychiatrists     Sent following supports:     Thank you for speaking to us,  Below is a list of psychiatrists in the area.  Including a telehealth psychiatrist Dr. Juanetta GoslingKattula who we  highly recommend.       Tilford PillarAmbika Kattula, MD will see patients for Psychopharmacology visits through Telehealth and will be available to consult with the [facility] PCP's [facility] has a Multimedia programmervirtual office in [place].   1.       Patient must be 18+   2.       Patient must reside in [place], have a [place] plan, and be in the [place] during the appointment.   3.       Patient must NOT be actively using substances and preferably should have 6 months of sobriety with treatment.   4.       Patient must be able to connect to Zoom through the patient portal [facility]. *Patients will not be seen in person, all visits are done through Telehealth.      1.       This service should not be utilized for patients who are stable on their current medication   2.       Patient should not be referred if their only diagnosis is ADHD, but can be referred if they have ADHD with additional diagnosis.   3.       Patient's insurance will be billed, so they should be aware of their copay and/or deductible and be able and willing to pay the fee at the time of their appointment through the portal [facility].    4.       If the patient's PCP is not in Epic, the patient must sign a release for [facility] to  obtain PCP visit notes and to allow for collaboration of care.   5.       The patient makes the call to the [facility] [place] Intake Line at [phone].   1.       The patient needs to have the following information ready at the time of the call:  PCP name, current medication list and their insurance card information.      Other psychiatrists in the area:  [facility] [place]  [address]  [place], [place] [id number]      [email or web address]  [phone]      Hessie DienerJuliana Pires  [facility]  [address]  [place], [place] [id number]        [facility]  [address]  [address]  [place], [place] [address]  [phone]     [facility]  [address]  [address]  [place], [place] [id number]  [phone]     [facility]  [place], [place] [id number]  [phone]  [phone]  [place], [place] [address]  [phone]     [facility]  [address]  [place], [place] [address]  [phone]    [facility]  [address]  [place], [place] [address]  [phone]    [facility]  [facility]  [address]  [place], [place] [address]  [phone]     Lubertha Basque  [place], [place] [address]  [phone]     [facility] [address] [place], [place]  [address]  [place], [place] [address]  [phone]     Michaelle Copas  [place], [place] [address]  [phone]     [facility] Teletherapy number: [phone]

## 2022-04-05 NOTE — Telephone Encounter (Signed)
Called.   Discussed

## 2022-04-06 ENCOUNTER — Encounter: Payer: MEDICARE | Primary: Family Medicine

## 2022-04-08 NOTE — Telephone Encounter (Signed)
[facility] INTAKE QUESTIONNAIRE    Name:Hannah [name]   DOB: [birth year]   Age: 76 y.o.   ZOX:[id number]     MR#: [id number]  Preferred Phone #: [phone]   Email: [email or web address]  Patient referred by: PCP      Primary Care Physician: Cheryle Horsfall, MD   Date of Last PCP appointment: August 21    Primary Insurance: Medicare [id number]   Secondary Insurance: BCBS [id number]      Presenting problem(s): On abilify for depression. Feeling much better the last few week. "MIRACLE" and feeling so much better. Pt does live part time in [place] and rest of the time in [place]. Pt will be going back to [facility] on OCT 14th coming back to [place] from end of Nov to early January. Sees PCP only when pt comes home to [place]. No issues due to travel with pcp. Pt aware of travel/scheduling policies for telehealth of being in the state of [place].    Lives with children in seperate apartment, same house. Sine starting abilify, pt able to function now during the day, no longer feels lazy or tired. Sleep has been good. Kids noticed big difference for pt. Living with spouse as well. Also mentioned ADHD possibly, not dx. No other concerns in the last 2 weeks since starting meds. But since end of May pt was in terrible depression and would not leave house. Off and on depression for last 68yrs or so. No specific trigger. Depressed then fine depressed for 3 months then fine again. During depression pt would sleep too much. Reports good memory and no cognitive issues. Independent for ADLs and house work. Also able to manage all her appts. TMS trial in [place] about 1.4yrs ago. [facility] in [facility] and saw psychiatry NP for meds for about 68yrs. Pt last saw in May but not interested in continuing with [facility] psych. They mentinoed bipolar but pt feels she is not. However pt will continue with PCP in [facility] along with Dr. Gwendel Hanson in [place].        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        Over the last two weeks, how severe  have your/the pt's symptoms been? 2 pt feels much better on abilify.     Any hx of SI/SIB/HI, including hx of violence: no   Current safety risk(s): feels safe and no weapons    Have you been treated for these problems before? yes psych last 3 yrs in [place] at [facility] and last saw in May 2023.   Any hospitalizations? no    Any legal history, past or current charges? (including but not limited to arrests, A&B's, restraining order(s), incarceration, probation, etc.) no       Do you currently use any illicit substances, including but not limited to Marijuana, Cocaine, Heroin/Fentanyl, PCP, etc.) no   Please list last use, substance, amount and frequency: No MJ    Do you have a history of any illicit substance use? no       Do you currently drink alcohol? yes   Please list amount and frequency: Drinks once a year on her Anniversary    Are you currently receiving Medication Assisted Treatment at another facility? no       Have you been treated for substance use disorder in a hospital, detox or outpatient program, including MAT treatment - Methadone, Suboxone, Vivitrol? No      Type of treatment you are currently  seeking: (check all that apply)  Psychiatry - Medication Management - Eval    Do you have a current therapist? no       Does anyone currently prescribe psychiatric medications for you? yes   If yes, name of prescriber: PCP now. Pt will not be continuing with practice in [place].       Narrative: Pt is a 76yo female who is seeking services for ongoing depression. PCP recommended pt continue med mngt with Dr. Juanetta Gosling. Pt was started on abilify from PCP for depression and has been feeling much better the last few week. Pt does live part time in [place] and rest of the time in [place]. Pt will be going back to [facility] on OCT 14th then coming back to [place] from end of Nov to early January. Sees PCP only when pt comes home to [place]. Pt aware of travel/scheduling policies for telehealth of being in the state of . Pt also mentioned a TMS  trial in [place] about 1.20yrs ago. Was doing ongoing psych tx at [facility] in [facility] and saw psychiatry NP for about 73yrs. Pt last saw in May but not interested in continuing with [facility] psych. However pt will continue with PCP in [facility] along with Dr. Gwendel Hanson in [place].     Lives with husband and adult children live in seperate apartment, same house. Since starting abilify, pt able to function now during the day and sleep has been good. No other concerns in the last 2 weeks since starting meds. But since end of May pt was in terrible depression and would not leave house. Off and on depression for last 57yrs or so. No specific trigger. Reports good memory and no cognitive issues. Independent for ADLs/house work and able to manage all her appts.       [x]  CHECK HERE: Cl. Was informed that their medications may be adjusted, and their current prescriptions are not guaranteed. This will be discussed in further detail with the provider should they schedule an appointment.     Next steps:  Needs review by Clinical Sup/MD Will see if records from [facility] are needed.

## 2022-05-13 NOTE — Telephone Encounter (Signed)
Called pt to advise that the pain is a SE of the antibiotic. Advised pt to take all of the antibiotic and pain will decrease after she finished. Pt agreed.

## 2022-05-13 NOTE — Telephone Encounter (Signed)
Pt called d/t pain in her joints, arms and knees. She received a tetnus shot last week after her Left ring finger was operated on. Is on an antibiotic from the surgery but wants to know if the pain the tetanus shot and is that normal?

## 2022-07-01 MED ORDER — ARIPiprazole (Abilify) 2 mg tablet
2 | ORAL_TABLET | Freq: Every day | ORAL | 1 refills | Status: DC
Start: 2022-07-01 — End: 2022-07-19

## 2022-07-20 NOTE — Telephone Encounter (Signed)
She is requesting a rf on her abilify but wants the 4 mg   Increased by 2 mg

## 2022-07-21 MED ORDER — ARIPiprazole (Abilify) 5 mg tablet
5 | ORAL_TABLET | Freq: Every day | ORAL | 1 refills | Status: DC
Start: 2022-07-21 — End: 2022-09-13

## 2022-08-12 ENCOUNTER — Ambulatory Visit: Admit: 2022-08-12 | Discharge: 2022-08-12 | Payer: MEDICARE | Attending: Family Medicine | Primary: Family Medicine

## 2022-08-12 DIAGNOSIS — F418 Other specified anxiety disorders: Secondary | ICD-10-CM

## 2022-08-12 MED ORDER — methylphenidate (Ritalin) 5 mg tablet
5 | ORAL_TABLET | Freq: Every day | ORAL | 0 refills | Status: DC
Start: 2022-08-12 — End: 2022-09-08

## 2022-08-12 MED ORDER — ZOLMitriptan (Zomig) 5 mg tablet
5 | ORAL_TABLET | Freq: Once | ORAL | 1 refills | 29.00000 days | Status: AC | PRN
Start: 2022-08-12 — End: ?

## 2022-08-12 NOTE — Progress Notes (Signed)
[facility]  [address]  [place] [address]  Dept: [phone]  Dept Fax: [phone]     Patient ID: Hannah [name] is a 77 y.o. female who presents for feeling depressed (Has had med changes     she feels and family members that shehas ADHD).    Subjective   HPI   Here in follow up.   Had  been depressed all summer. abilify 2 mg helped for a month.    Increased to 5 mg.  No better.     No motivation.  Still thinking clearly,  Appetitie.    Getting out occasionally.     Looks forward to sleeping at night.   seroquel helps her sleep.  paxil has not been effective.  Used to work well.    Has been on numerous antidepressants in the past.    No headaches.   Sinuses OK.  Breathing  OK. Sedentary.   Bowels are constipated.      No swelling of legs.   Less tremulous.   Fatigue.  No energy.  No motivation.  Some joint pains.      Current Outpatient Medications   Medication Instructions   . ARIPiprazole (ABILIFY) 5 mg, oral, Daily   . atorvastatin (Lipitor) 20 mg tablet Take 1 tablet by mouth once daily   . CALCIUM CARBONATE ORAL 600 mg, oral, Daily RT   . cholecalciferol (Vitamin D-3) 50 MCG (2000 UT) tablet 1 tablet, oral, Daily RT   . cyanocobalamin (VITAMIN B-12) 500 mcg, oral, Daily RT   . losartan (COZAAR) 50 mg, oral, Daily   . methylphenidate (RITALIN) 5 mg, oral, Daily before breakfast   . omeprazole (PRILOSEC) 20 mg, oral, Daily before breakfast, Do not crush or chew.    Marland Kitchen PARoxetine (PAXIL) 40 mg, oral, Every morning   . QUEtiapine XR (SEROQUEL XR) 100 mg, oral, Nightly   . ZOLMitriptan (ZOMIG) 5 mg, oral, Once as needed, May repeat in 2 hours if unresolved. Do not exceed 10 mg in 24 hours.   All: listed.  Soc:  No smoking, no alcohol.   [place]inters in [place]   Does no exercise.    Review of Systems   Constitutional: Negative.    HENT: Positive for hearing loss.    Respiratory: Negative.    Cardiovascular: Negative.    Gastrointestinal:  Negative.    Genitourinary: Negative.    Musculoskeletal: Positive for neck pain.   Skin: Negative.    Neurological: Positive for dizziness and tremors.   Psychiatric/Behavioral: Negative for depression.     Objective   Visit Vitals  BP 120/84   Temp 37.1 C (98.7 F)   Wt 83.9 kg   BMI 36.74 kg/m   BSA 1.88 m       Physical Exam  BP 120/84   Temp 37.1 C (98.7 F)   Wt 83.9 kg   BMI 36.74 kg/m     General Appearance:    Alert, cooperative, no distress,  Coherent and intelligent   Head:    Normocephalic, without obvious abnormality, atraumatic   Eyes:    PERRL, conjunctiva/corneas clear, EOM's intact, fundi     benign, both eyes   Ears:    Normal TM's and external ear canals, both ears   Nose:   Nares normal, septum midline, mucosa normal, no drainage     or sinus tenderness   Throat:   Lips, mucosa, and tongue normal;  teeth and gums normal   Neck:   Supple, symmetrical, trachea midline, no adenopathy;     thyroid:  no enlargement/tenderness/nodules; no carotid    bruit or JVD   Back:     Symmetric, no curvature, ROM normal, no CVA tenderness   Lungs:     Clear to auscultation bilaterally, respirations unlabored   Chest Wall:    No tenderness or deformity    Heart:    Regular rate and rhythm, S1 and S2 normal, no murmur, rub    or gallop   Breast Exam:       Abdomen:     Soft, non-tender, bowel sounds active all four quadrants,     no masses, no organomegaly   Genitalia:     Rectal:     Extremities:   Extremities normal, atraumatic, no cyanosis or edema   Pulses:   2+ and symmetric all extremities   Skin:   Skin color, texture, turgor normal, no rashes or lesions   Lymph nodes:   Cervical, supraclavicular, and axillary nodes normal   Neurologic:   CNII-XII intact.  Mild tremor     Assessment/Plan   Hannah [name] was seen today for feeling depressed.  Depression with anxiety  Comments:  improved for short time on abilify. now worse again.  may benefit from stimulant for motivation,   attention, concentration,  focus.  Attention deficit  Comments:  start ritalin 5 mg daily.  monitor BP and HR.    Orders:  -     methylphenidate (Ritalin) 5 mg tablet; Take 1 tablet (5 mg) by mouth before breakfast.  Periodic headache syndrome, not intractable  Comments:  takes zomig prn.  refilled.   Orders:  -     ZOLMitriptan (Zomig) 5 mg tablet; Take 1 tablet (5 mg) by mouth 1 (one) time if needed for migraine. May repeat in 2 hours if unresolved. Do not exceed 10 mg in 24 hours.    Lab testing fine.  Other problems stable.

## 2022-09-08 MED ORDER — methylphenidate (Ritalin) 5 mg tablet
5 | ORAL_TABLET | Freq: Every day | ORAL | 0 refills | Status: DC
Start: 2022-09-08 — End: 2022-09-29

## 2022-09-13 MED ORDER — QUEtiapine XR (SEROquel XR) 50 mg 24 hr tablet
50 | ORAL_TABLET | Freq: Every evening | ORAL | 1 refills | Status: AC
Start: 2022-09-13 — End: 2023-03-12

## 2022-09-29 MED ORDER — PARoxetine (Paxil) 40 mg tablet
40 | ORAL_TABLET | Freq: Every morning | ORAL | 1 refills | Status: AC
Start: 2022-09-29 — End: ?

## 2022-09-29 MED ORDER — methylphenidate (Ritalin) 5 mg tablet
5 | ORAL_TABLET | Freq: Every day | ORAL | 0 refills | Status: DC
Start: 2022-09-29 — End: 2022-11-04

## 2022-10-04 NOTE — Telephone Encounter (Signed)
They are questioning methylphenidate being 76  Why is she using this  Out of state pharmacy  [place]    They need a call back  [phone]

## 2022-10-26 IMAGING — MR MRI BRAIN WITH  IAC W/WO CONTRAST
10 of 17 series · 22 of 48 positions shown · IV contrast (gadavist)
Comparison: 

________________________________________________________________________________________________ 
MRI BRAIN WITH  IAC W/WO CONTRAST,10/26/2022 [DATE]: 
CLINICAL INDICATION: History of skin cancer. Prior vestibular schwannoma 
removal. Abnormal sensation around the mouth.
TECHNIQUE: MRI performed without and with contrast using IAC protocol.  8.5 mL 
of Gadavist were injected intravenously by hand. 1.5 mL of Gadavist were 
discarded. Patient was scanned on a 1.5T magnet.

[Series 102: mpr - smartbrain · axial · 1.1mm · 1.09mm/px · 1 of 2 slices shown]
[im 1/2]
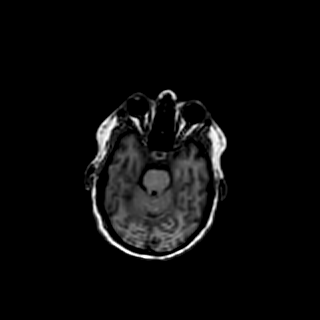

[Series 103: patient aligned mpr · axial · 21.9mm · 1.09mm/px · z∈[-150,+195]mm · 2 of 52 slices shown]
[im 1/52]
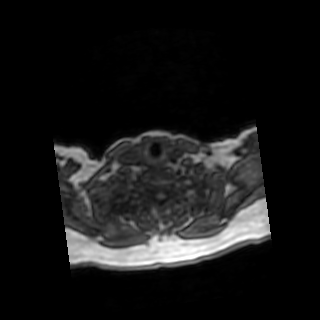
[im 52/52]
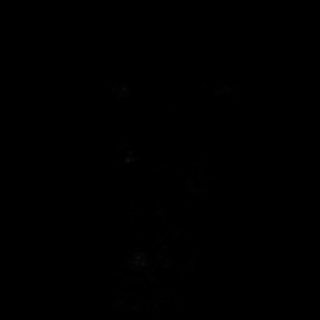

[Series 203: dadc map · axial · 5.0mm · 1.00mm/px · 1 of 27 slices shown (1 of 2)]
[im 1/27]
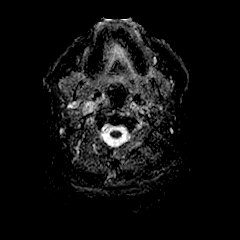

[Series 204: (id) · axial · 5.0mm · 1.00mm/px · 1 of 27 slices shown]
[im 1/27]
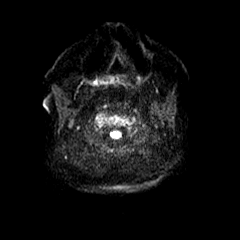

[Series 303: dadc map · coronal · 5.0mm · 0.81mm/px · 2 of 34 slices shown (2 of 2)]
[im 1/34]
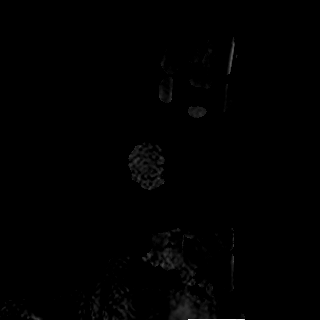
[im 34/34]
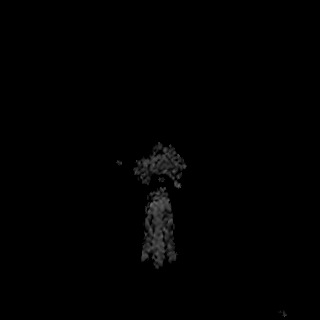

[Series 601: SWI · axial · 3.0mm · 0.40mm/px · z∈[-53,+95]mm · 11 of 200 slices shown]
[im 1/200]
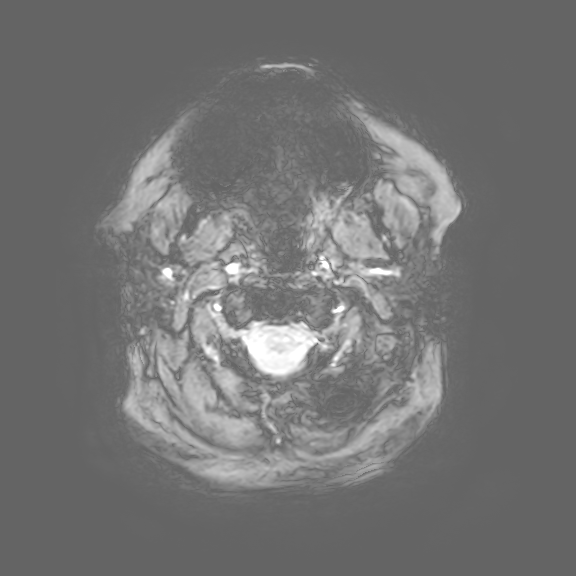
[im 20/200]
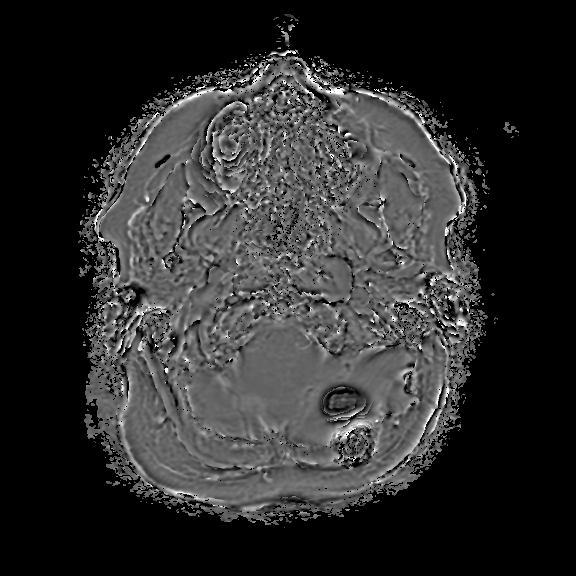
[im 40/200]
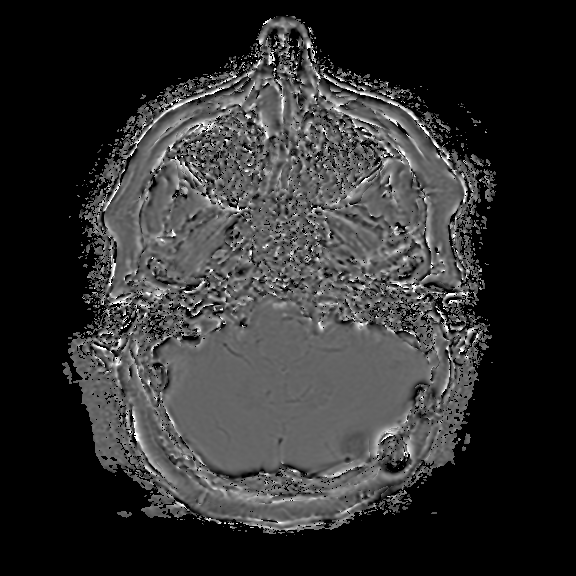
[im 60/200]
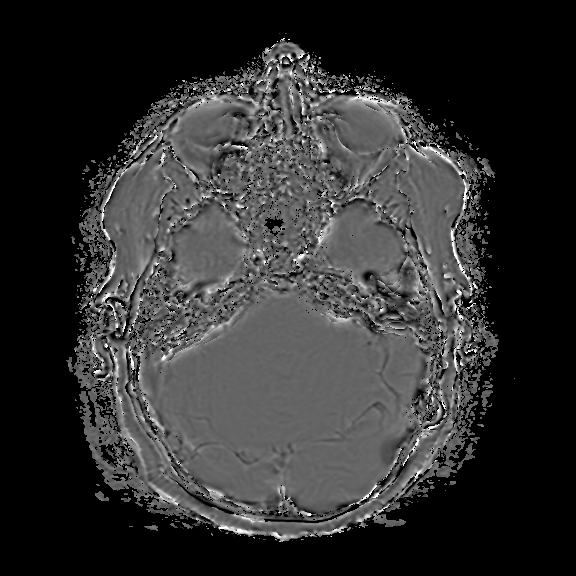
[im 80/200]
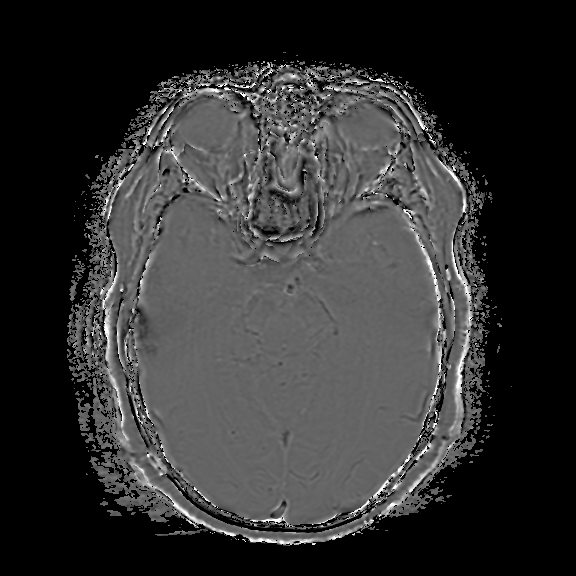
[im 100/200]
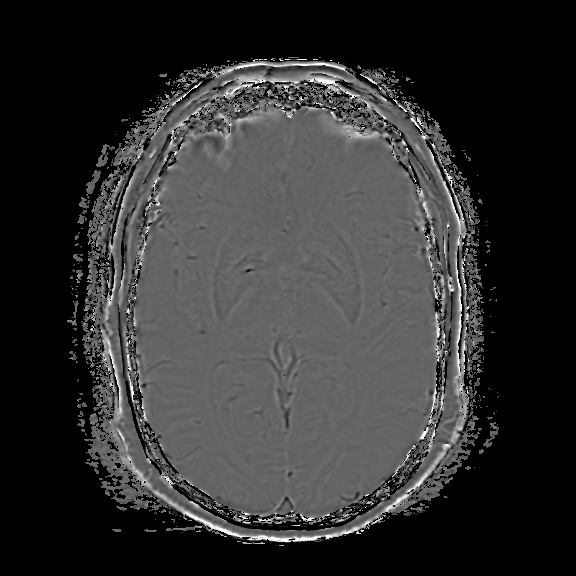
[im 120/200]
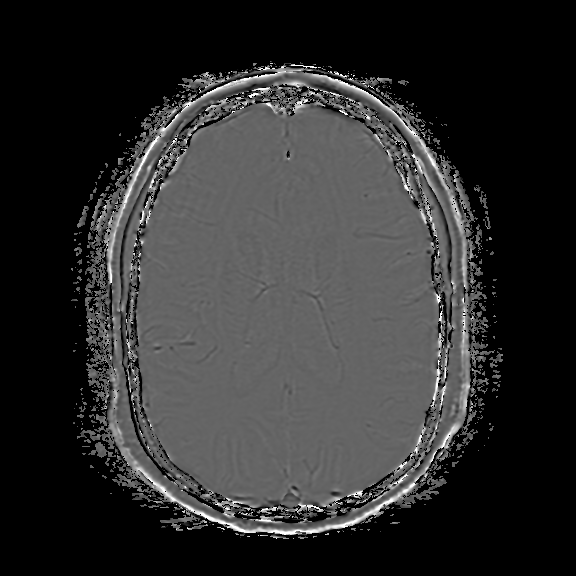
[im 140/200]
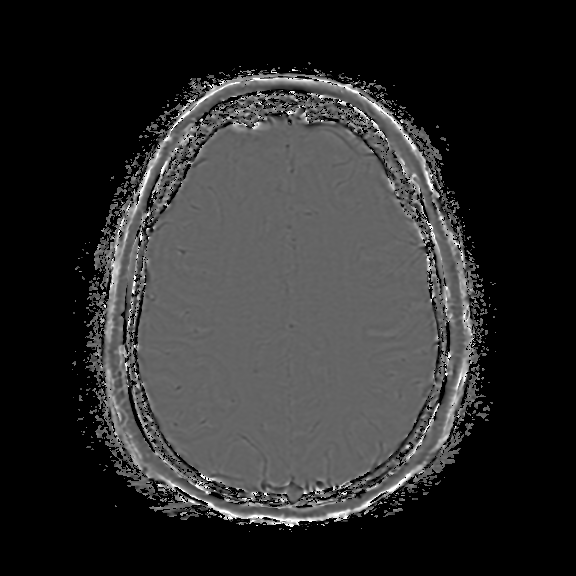
[im 160/200]
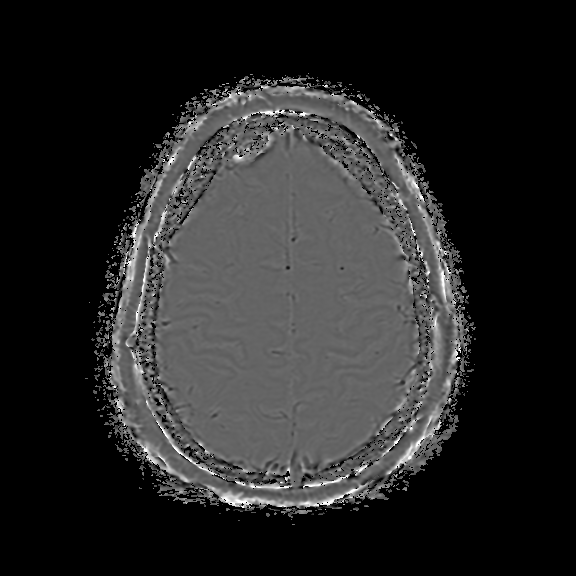
[im 180/200]
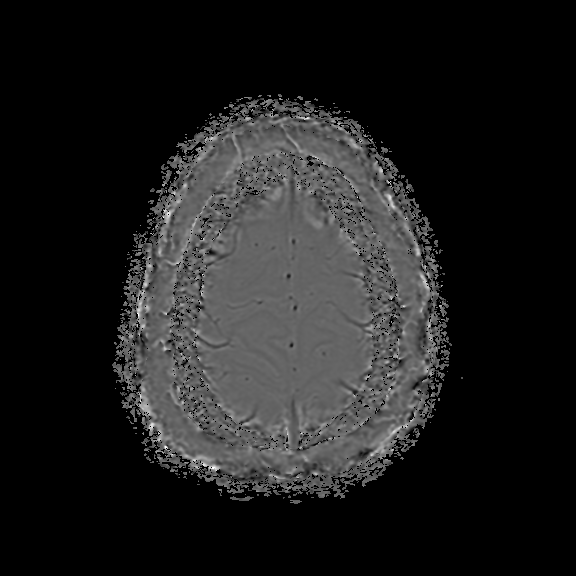
[im 200/200]
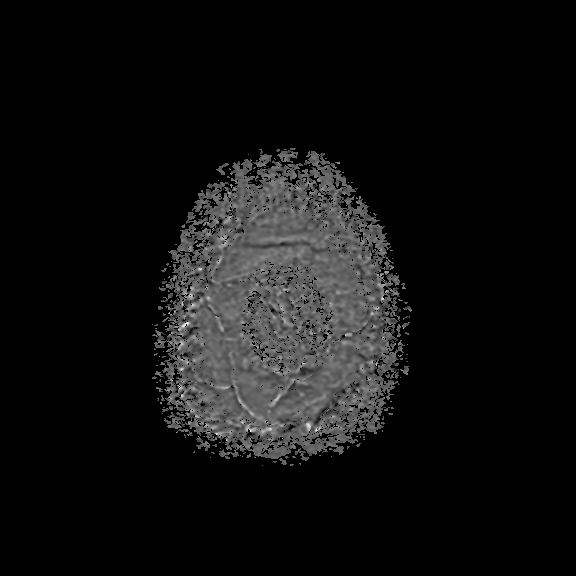

[Series 801: T1 · axial · 3.0mm · 0.59mm/px · 1 of 11 slices shown]
[im 1/11]
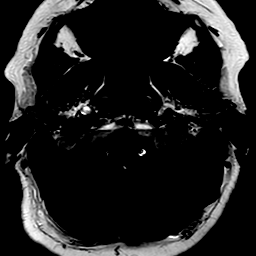

[Series 901: T1 fat-sat post-contrast · axial · 3.0mm · 0.59mm/px · 1 of 11 slices shown (1 of 2)]
[im 1/11]
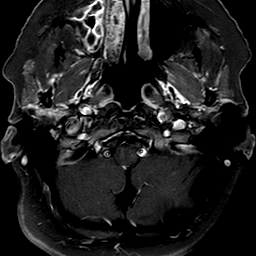

[Series 1001: T1 fat-sat post-contrast · coronal · 3.0mm · 0.59mm/px · 1 of 11 slices shown (2 of 2)]
[im 1/11]
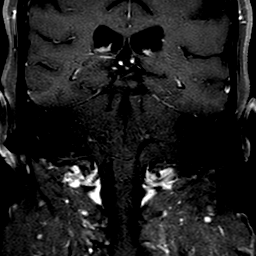

[Series 1101: T1 post-contrast · axial · 5.0mm · 0.51mm/px · 1 of 27 slices shown]
[im 1/27]
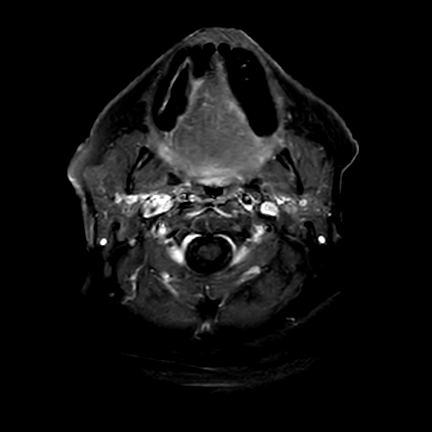

[22 of 48 positions shown; findings below may reference images not displayed]

FINDINGS: -------------------------------------------------------------------------------- 
------ 
INTERNAL AUDITORY CANALS:  
There is widening with a bulbous appearance involving the left cerebellar 
pontine angle cistern region, likely postoperative. No evidence of recurrent or 
residual soft tissue mass lesion within the left cerebellar pontine angle 
cistern or the left internal auditory canal. There is abnormal appearance to the 
left vestibule and left semicircular canals which appear dysmorphic and poorly 
visualized. This may be on a postoperative basis, however. The left cochlea 
demonstrates expected fluid signal intensity. The right temporal bone is 
unremarkable. 
No abnormal temporal bone enhancement. The prepontine cistern is unremarkable. 
There is a vascular structure that projects in close proximity to the root entry 
zone of the right trigeminal nerve. Similar finding involving the left 
trigeminal nerve. Otherwise the trigeminal nerves appear unremarkable. No 
abnormal skull base enhancement. 
-------------------------------------------------------------------------------- 
------ 
INTRACRANIAL:  
There are previous postsurgical changes involving the left occipital region. 
These appear present within the left occipital bone. There is a punctate focus 
of susceptibility involving the left lateral cerebellar hemisphere. There are 
additional scattered foci of susceptibility along the left lateral medulla.  
Otherwise there is a mild degree of subcortical, deep white matter, and 
periventricular T-2 FLAIR hyperintensity, most likely reflective of chronic 
fossa vascular change, although ultimately nonspecific. No mass or abnormal 
extra-axial fluid collection. No abnormal intracranial enhancement.. No acute 
ischemia. Patency of the intracranial vascular flow voids.  No acute 
intracranial hemorrhage, mass effect, midline shift.  No hydrocephalus. Cerebral 
volume is age appropriate. 
-------------------------------------------------------------------------------- 
------ 
OTHER: 
ORBITS/SINUSES:  Visualized orbits show no acute abnormality or mass.  Moderate 
membrane thickening right maxillary sinus. 
MARROW SIGNAL/SOFT TISSUES: No focal suspect signal abnormality. 
-------------------------------------------------------------------------------- 
------
IMPRESSION: There are vascular structures that project in close proximity to the root entry 
zones of both trigeminal nerves. Otherwise the trigeminal nerve regions and 
skull base appears unremarkable. 
Previous postsurgical changes involving the left occipital region with bulbous 
type widening of the left cerebellar pontine angle cistern and left IAC, on a 
postsurgical basis. There is no evidence of recurrent or residual mass lesion 
within the left internal auditory canal to suggest residual vestibular 
schwannoma. 
The left vestibule is poorly visualized and appears dysmorphic, which may be on 
a postoperative basis. 
Moderate right maxillary sinus membrane thickening.

## 2022-11-04 MED ORDER — methylphenidate (Ritalin) 5 mg tablet
5 | ORAL_TABLET | Freq: Every day | ORAL | 0 refills | Status: DC
Start: 2022-11-04 — End: 2022-12-03

## 2022-11-16 NOTE — Telephone Encounter (Signed)
Has been taking ritalin for 3 months and has been doing well but the last 2-3 days feels she is sleeping more again, not feeling like herself. She is worried she will get back into a depression wondering if this can be increased. ( medication for adhd not depression?)   is currently [place] and uses [facility] [place]

## 2022-11-16 NOTE — Telephone Encounter (Signed)
Noted  

## 2022-12-03 MED ORDER — methylphenidate (Ritalin) 5 mg tablet
5 | ORAL_TABLET | Freq: Every day | ORAL | 0 refills | Status: DC
Start: 2022-12-03 — End: 2023-01-17

## 2022-12-14 MED ORDER — PARoxetine (Paxil) 10 mg tablet
10 | ORAL_TABLET | Freq: Every morning | ORAL | 1 refills | Status: AC
Start: 2022-12-14 — End: 2023-02-12

## 2022-12-22 ENCOUNTER — Ambulatory Visit: Admit: 2022-12-22 | Discharge: 2022-12-22 | Payer: MEDICARE | Attending: Family Medicine | Primary: Family Medicine

## 2022-12-22 DIAGNOSIS — F418 Other specified anxiety disorders: Secondary | ICD-10-CM

## 2022-12-22 MED ORDER — sertraline (Zoloft) 100 mg tablet
100 | ORAL_TABLET | Freq: Every day | ORAL | 1 refills | Status: AC
Start: 2022-12-22 — End: 2023-02-20

## 2022-12-22 NOTE — Progress Notes (Signed)
[facility]  [facility]  [address]  [address]  [place] [address]  Dept: [phone]  Dept Fax: [phone]     Patient ID: Hannah [name] is a 77 y.o. female who presents for very depressed  since coming home from [place]..    Subjective   HPI   Here in follow up.   tx for depression.   Has been on paxil, no longer effective.    Switch to zoloft       No motivation.  Depressed.    Husband has dementia.     Still thinking clearly,  Appetitie.    Getting out occasionally.     seroquel helps her sleep.  paxil has not been effective.  Used to work well.    Has been on numerous antidepressants in the past.    No headaches.   Sinuses OK.  Breathing  OK. Sedentary.   Bowels are fine.        No swelling of legs.   Less tremulous.   Fatigue.  No energy.  No motivation.  Some joint pains.      Current Outpatient Medications   Medication Instructions   . atorvastatin (Lipitor) 20 mg tablet Take 1 tablet by mouth once daily   . CALCIUM CARBONATE ORAL 600 mg, oral, Daily RT   . cholecalciferol (Vitamin D-3) 50 MCG (2000 UT) tablet 1 tablet, oral, Daily RT   . cyanocobalamin (VITAMIN B-12) 500 mcg, oral, Daily RT   . losartan (COZAAR) 50 mg, oral, Daily   . methylphenidate (RITALIN) 5 mg, oral, Daily before breakfast   . omeprazole (PRILOSEC) 20 mg, oral, Daily before breakfast, Do not crush or chew.    Marland Kitchen PARoxetine (PAXIL) 40 mg, oral, Every morning   . PARoxetine (PAXIL) 10 mg, oral, Every morning   . QUEtiapine XR (SEROQUEL XR) 100 mg, oral, Nightly   . sertraline (ZOLOFT) 100 mg, oral, Daily   . ZOLMitriptan (ZOMIG) 5 mg, oral, Once as needed, May repeat in 2 hours if unresolved. Do not exceed 10 mg in 24 hours.   All: listed.  Soc:  No smoking, no alcohol.   [place]inters in [place]   Does no exercise.    Review of Systems   Constitutional: Negative.    HENT: Positive for hearing loss.    Respiratory: Negative.    Cardiovascular: Negative.    Gastrointestinal: Negative.     Genitourinary: Negative.    Musculoskeletal: Positive for neck pain.   Skin: Negative.    Neurological: Positive for dizziness and tremors.   Psychiatric/Behavioral: Negative for depression.     Objective   Visit Vitals  BP 128/78   Temp 37.1 C (98.7 F)   Wt 83.9 kg   BMI 36.74 kg/m   BSA 1.88 m       Physical Exam  BP 128/78   Temp 37.1 C (98.7 F)   Wt 83.9 kg   BMI 36.74 kg/m     General Appearance:    Alert, cooperative, no distress,  Coherent and intelligent   Head:    Normocephalic, without obvious abnormality, atraumatic   Eyes:    PERRL, conjunctiva/corneas clear, EOM's intact, fundi     benign, both eyes   Ears:    Normal TM's and external ear canals, both ears   Nose:   Nares normal, septum midline, mucosa normal, no drainage     or sinus tenderness   Throat:   Lips, mucosa, and  tongue normal; teeth and gums normal   Neck:   Supple, symmetrical, trachea midline, no adenopathy;     thyroid:  no enlargement/tenderness/nodules; no carotid    bruit or JVD   Back:     Symmetric, no curvature, ROM normal, no CVA tenderness   Lungs:     Clear to auscultation bilaterally, respirations unlabored   Chest Wall:    No tenderness or deformity    Heart:    Regular rate and rhythm, S1 and S2 normal, no murmur, rub    or gallop   Breast Exam:       Abdomen:     Soft, non-tender, bowel sounds active all four quadrants,     no masses, no organomegaly   Genitalia:     Rectal:     Extremities:   Extremities normal, atraumatic, no cyanosis or edema   Pulses:   2+ and symmetric all extremities   Skin:   Skin color, texture, turgor normal, no rashes or lesions   Lymph nodes:   Cervical, supraclavicular, and axillary nodes normal   Neurologic:   CNII-XII intact.  Mild tremor     Assessment/Plan   Soren was seen today for very depressed  since coming home from [place]..  Depression with anxiety  Comments:  taper off paxil.  start zoloft.     discussed  treatment options, counselling.  not suicidal.  but no motivation and  anxious about the future   Orders:  -     sertraline (Zoloft) 100 mg tablet; Take 1 tablet (100 mg) by mouth once daily.  Attention deficit  Comments:  did well on ritalin 5 mg last year.     can restart after a medication holiday.   BP and HR controlled.   Essential hypertension  Comments:  controlled.     Lab testing fine.  Other problems stable.

## 2022-12-29 ENCOUNTER — Encounter: Payer: MEDICARE | Attending: Family Medicine | Primary: Family Medicine

## 2023-01-17 MED ORDER — methylphenidate (Ritalin) 5 mg tablet
5 | ORAL_TABLET | Freq: Every day | ORAL | 0 refills | Status: DC
Start: 2023-01-17 — End: 2023-03-29

## 2023-01-17 NOTE — Telephone Encounter (Signed)
Sertraline because it was making her have loose stools. She continued to take quitiapine  she will continue with tha if thats ok with you.    She also need a refill on amphetamine

## 2023-01-17 NOTE — Telephone Encounter (Signed)
Called.  Lets set up with counselling for depression

## 2023-01-25 ENCOUNTER — Ambulatory Visit: Admit: 2023-01-25 | Discharge: 2023-01-25 | Payer: MEDICARE | Attending: Otolaryngology | Primary: Family Medicine

## 2023-01-25 DIAGNOSIS — H9313 Tinnitus, bilateral: Secondary | ICD-10-CM

## 2023-01-25 NOTE — Progress Notes (Signed)
[facility]  [facility]  [address]  [place] [address]  Dept: [phone]  Dept Fax: [phone]     Chief Complaint: Hannah [name] is a 77 y.o. female who presents for evaluation of hearing loss.     Subjective   She did obtain hearing aids ([employer]). About three months ago, she noticed ringing in the right ear. She did have tinnitus in the left ear after her vestibular schwannoma resection thirty years ago. She had a normal MRI Brain in [place] recently.     She took high dose prednisone for 2 weeks and denies any improvement in symptoms. She brought in her audiogram from 08/22/20 in [place] which shows slightly better hearing in the right ear.     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 She hears a sound in the left ear when moving her eyes. Two weeks ago she started wellbutrin and suddenly noticed a white noise sound in both ears. The right ear did sound muffled at that time which was a sudden change.    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   Patient's hearing is decreased compared to the previous audiogram.     Past Medical History:   Diagnosis Date    Acoustic neuroma (CMS-HCC) (HHS-HCC)     Chronic GERD     Depression with anxiety     Essential hypertension     Family hx of colon cancer     Hyperlipidemia     Lichen sclerosus     Osteopenia determined by x-ray     Vitamin D deficiency         Past Surgical History:   Procedure Laterality Date    ADENOIDECTOMY      APPENDECTOMY      RECTAL SURGERY  1973    rectal fissurectomies    TONSILLECTOMY          Family History   Problem Relation Name Age of Onset    Colon cancer Mother          Social History     Socioeconomic History    Marital status: Married     Spouse name: Not on file     Number of children: Not on file    Years of education: Not on file    Highest education level: Not on file   Occupational History    Not on file   Tobacco Use    Smoking status: Never    Smokeless tobacco: Never   Vaping Use    Vaping Use: Never used   Substance and Sexual Activity    Alcohol use: Never    Drug use: Never    Sexual activity: Defer   Other Topics Concern    Not on file   Social History Narrative    Not on file     Social Determinants of Health     Financial Resource Strain: Low Risk  (03/24/2022)    Overall Financial Resource Strain (CARDIA)     Difficulty of Paying Living Expenses: Not very hard   Food Insecurity: No Food Insecurity (03/24/2022)    Hunger Vital Sign     Worried About Running Out of Food in the Last Year: Never true     Ran Out of Food  in the Last Year: Never true   Transportation Needs: No Transportation Needs (03/24/2022)    PRAPARE - Therapist, art (Medical): No     Lack of Transportation (Non-Medical): No   Physical Activity: Sufficiently Active (03/24/2022)    Exercise Vital Sign     Days of Exercise per Week: 7 days     Minutes of Exercise per Session: 30 min   Stress: No Stress Concern Present (03/24/2022)    [employer] of Occupational Health - Occupational Stress Questionnaire     Feeling of Stress : Only a little   Social Connections: Socially Integrated (03/24/2022)    Social Connection and Isolation Panel [NHANES]     Frequency of Communication with Friends and Family: Three times a week     Frequency of Social Gatherings with Friends and Family: More than three times a week     Attends Religious Services: 1 to 4 times per year     Active Member of [employer] or Organizations: Yes     Attends Banker Meetings: Never     Marital Status: Married   Catering manager Violence: Not At Risk (03/24/2022)    Humiliation, Afraid, Rape, and Kick questionnaire     Fear of Current or Ex-Partner: No     Emotionally Abused: No     Physically Abused: No      Sexually Abused: No   Housing Stability: Unknown (03/24/2022)    Housing Stability Vital Sign     Unable to Pay for Housing in the Last Year: No     Number of Places Lived in the Last Year: Not on file     Unstable Housing in the Last Year: No        Current Outpatient Medications   Medication Sig Dispense Refill    atorvastatin (Lipitor) 20 mg tablet Take 1 tablet by mouth once daily 90 tablet 0    CALCIUM CARBONATE ORAL Take 600 mg by mouth in the morning.      cholecalciferol (Vitamin D-3) 50 MCG (2000 UT) tablet Take 1 tablet by mouth in the morning.      cyanocobalamin (Vitamin B-12) 500 mcg tablet Take 500 mcg by mouth in the morning.      losartan (Cozaar) 50 mg tablet Take 1 tablet by mouth once daily 90 tablet 1    methylphenidate (Ritalin) 5 mg tablet Take 1 tablet (5 mg) by mouth before breakfast. 30 tablet 0    omeprazole (PriLOSEC) 20 mg DR capsule Take 1 capsule (20 mg) by mouth before breakfast. Do not crush or chew. 90 capsule 1    PARoxetine (Paxil) 10 mg tablet Take 1 tablet (10 mg) by mouth in the morning. 30 tablet 1    PARoxetine (Paxil) 40 mg tablet Take 1 tablet (40 mg) by mouth in the morning. 90 tablet 1    QUEtiapine XR (SEROquel XR) 50 mg 24 hr tablet Take 2 tablets (100 mg) by mouth at bedtime. 180 tablet 1    sertraline (Zoloft) 100 mg tablet Take 1 tablet (100 mg) by mouth once daily. 30 tablet 1    ZOLMitriptan (Zomig) 5 mg tablet Take 1 tablet (5 mg) by mouth 1 (one) time if needed for migraine. May repeat in 2 hours if unresolved. Do not exceed 10 mg in 24 hours. 6 tablet 1     No current facility-administered medications for this visit.        Allergies   Allergen Reactions  Codeine     Eucalyptus     Sulfa (Sulfonamide Antibiotics) Rash        Review of Systems:  Pertinent positives and HEENT review of systems noted in HPI.    Objective   There were no vitals taken for this visit.    ENT Physical Exam    Constitutional:  General Appearance: No apparent distress, alert.    Orbits  Extraocular movements are intact, no spontaneous nystagmus.   Ear:  Right: Pinna without lesions. Cerumen occluding canal - extracted. External auditory canal is clear without edema or erythema. The tympanic membrane is intact without evidence of middle ear effusion.   Left: Pinna without lesions. Cerumen occluding canal - extracted. External auditory canal is clear without edema or erythema. The tympanic membrane is intact without evidence of middle ear effusion.   Facial nerve: Facial movement is intact and symmetric.   Nose: Speculum exam: Anterior nasal cavity is clear. The caudal septal mucosa is normal. The anterior face of the bilateral inferior turbinates is unremarkable. No obvious purulence or polyps.  Oral cavity/Oropharynx: Lips: No lesions. Floor of mouth: without lesions. Tongue: symmetric movement, no masses. Palate: no masses, symmetric elevation, uvula midline. Oropharynx:  no erythema or exudate.  No masses or lesions.   Posterior oropharyngeal wall without mass or lesion.  Normal mucosa.   Larynx/Hypopharynx: voice is normal.  Neck: Thyroid: No palpable nodules. Palpation: No masses, adenopathy.   Skin of Head/Neck: No obvious lesions.   Cranial Nerve assessment:  Nerves 3 - 12: symmetric bilaterally.    Procedure: Cerumen Extraction under otomicroscopy.    The cerumen impairs the examination of clinically significant portions of the external auditory canal and tympanic membrane bilaterally; The cerumen itself is noted to be obstructive and irritative and can cause symptoms of sometimes pain, itching, or hearing loss; Copius amounts of cerumen cannot be removed without magnification and multiple instrumentation requiring physician skills. A combination of a loop curette and suction were used to remove cerumen. Patient tolerated the procedure well.    Assessment/Plan   Hannah [name] was seen today for hearing loss.  Bilateral nonpulsatile tinnitus  -     Auditory function tests  Sensorineural  hearing loss (SNHL) of right ear with restricted hearing of left ear  Bilateral impacted cerumen    Bilateral cerumen extracted. She has right-sided SNHL. Recommend audiology evaluation for hearing aid adjustment.   I discussed the pathophysiology of subjective tinnitus which is the brain's response to peripheral auditory system input deprivation. Causes of tinnitus include certain medications (aspirin, NSAID, diuretics, chemotherapeutics), neoplasm (vascular tumors, retrocochlear pathology), and can be associated with smoking, reduced sleep, stress, depression, noise exposure, thyroid disease.   I discussed treatment paradigm for tinnitus including masking sounds, tinnitus re-training, hearing amplification devices which can help some people, and rarely anti-depressants or anxiolytic medications which would warrant psychiatric evaluation prior to prescription. Patient will try masking at this time.      [place] Service feels comfortable with the plan as outlined above. All questions and concerns were addressed in full detail. I will be contacted if there are any persistent, worsening, recurrent or new problems or concerns. Potential worrisome issues and symptoms reviewed. It was a pleasure meeting with Hannah [name] Service today.

## 2023-01-25 NOTE — Progress Notes (Signed)
This [facility] called outbound to pt.  RE: BHIP referral for Long hx of depression.   Counselling recommended   Called on 6/25 Left Message - 7/1 LM sent text  7/2 - sent [facility] msg.   Will close program at this time   Sent communication to PCP

## 2023-02-15 ENCOUNTER — Encounter: Payer: MEDICARE | Attending: Otolaryngology | Primary: Family Medicine

## 2023-02-24 NOTE — Telephone Encounter (Signed)
Recvd vm from patient who states she stopped seroquel. Last week she alternated taking medication QOD and now stopped. She is now c/o not doing well. Having itching. Has been unable to sleep and experiencing a weird like labored breathing feeling. Not sure if this has to do with stopping med. Please advise.

## 2023-02-24 NOTE — Telephone Encounter (Signed)
Called. Ans machine.

## 2023-02-25 NOTE — Telephone Encounter (Signed)
Called.   OK to restart seroquel

## 2023-03-16 MED ORDER — atorvastatin (Lipitor) 20 mg tablet
20 | ORAL_TABLET | Freq: Every day | ORAL | 1 refills | 90.00000 days | Status: AC
Start: 2023-03-16 — End: ?

## 2023-03-16 NOTE — Progress Notes (Signed)
Patient ID: HAYA RINDAL is a 77 y.o. female requesting in person therapy.    Subjective   DX depression with anxiety    Objective   8/21 [facility] placed initial call. Pt's daughter reached. Daughter reports pt's depression is worsening which is affecting her well being and the family. Daughter reports it would be helpful for pt to speak with someone.   Assessment/Plan   [facility] sent daughter resources and will f/u.    [facility]  [address] [place], [place] [address]  [phone] / [phone]  [email or web address] .[facility]   Several locations  [phone]  [email or web address]      [facility]  [address], [place] [address]  [phone]      [facility]   [address], [place], [place] [address]   [phone]  [email or web address]      [facility]  [address]  [place], [place] [address]  [phone]

## 2023-03-23 NOTE — Progress Notes (Signed)
8/28 [facility] placed call to f/u. No response. BHSS left VM and appropriate contact information. [facility] will close out referral as resources were provided and pt was unable to be reached.

## 2023-03-29 MED ORDER — methylphenidate (Ritalin) 5 mg tablet
5 | ORAL_TABLET | Freq: Every day | ORAL | 0 refills | Status: DC
Start: 2023-03-29 — End: 2023-04-06

## 2023-03-29 NOTE — Telephone Encounter (Signed)
Pmp last filled 01/17/23

## 2023-04-01 NOTE — Telephone Encounter (Signed)
Pt's daughter called, Pt has apt on 9/16 with PH. Was wondering if anything sooner, states pt is not doing well at all. No availability as of right now. Could we possibly call daughter or get sooner apt for pt?

## 2023-04-05 ENCOUNTER — Ambulatory Visit: Admit: 2023-04-05 | Discharge: 2023-04-05 | Payer: MEDICARE | Attending: Family Medicine | Primary: Family Medicine

## 2023-04-05 DIAGNOSIS — F418 Other specified anxiety disorders: Secondary | ICD-10-CM

## 2023-04-05 MED ORDER — citalopram (CeleXA) 20 mg tablet
20 | ORAL_TABLET | Freq: Every day | ORAL | 1 refills | 90.00000 days | Status: DC
Start: 2023-04-05 — End: 2023-04-06

## 2023-04-05 NOTE — Progress Notes (Signed)
[facility]  [facility]  [address]  [address]  [place] [address]  Dept: [phone]  Dept Fax: [phone]     Patient ID: Hannah [name] is a 77 y.o. female who presents for constant depression (Has a coolnesss across face and lips--feels anxious  feels like suffacating.).    Subjective   HPI   Here in follow up.  Has been depressed.      No motivation.  Depressed.   Sits arounds all day.    Husband has dementia.     Still thinking clearly,  no appetite.  Not drinking much.  Stay at home.    seroquel helps her sleep.  Was on paxil and zoloft.      Has been on numerous antidepressants in the past.    No headaches.   Sinuses OK.  Breathing  OK. Sedentary.   Bowels are fine.        No swelling of legs.   Less tremulous.   Fatigue.  No energy.  No motivation.  Some joint pains.     Numbness of lower lip.     Tinnitus.       Current Outpatient Medications   Medication Instructions    atorvastatin (LIPITOR) 20 mg, oral, Daily    citalopram (CELEXA) 20 mg, oral, Daily    losartan (COZAAR) 50 mg, oral, Daily    methylphenidate (RITALIN) 5 mg, oral, Daily before breakfast    omeprazole (PRILOSEC) 20 mg, oral, Daily before breakfast, Do not crush or chew.     QUEtiapine XR (SEROQUEL XR) 100 mg, oral, Nightly    ZOLMitriptan (ZOMIG) 5 mg, oral, Once as needed, May repeat in 2 hours if unresolved. Do not exceed 10 mg in 24 hours.   All: listed.  Soc:  No smoking, no alcohol.   [place]   Does no exercise.    Review of Systems   Constitutional: Negative.    HENT:  Positive for hearing loss.    Respiratory: Negative.     Cardiovascular: Negative.    Gastrointestinal: Negative.    Genitourinary: Negative.    Musculoskeletal:  Positive for neck pain.   Skin: Negative.    Neurological:  Positive for dizziness and tremors.   Psychiatric/Behavioral:  Negative for depression.      Objective   Visit Vitals  BP 130/80   Temp 37.1 C (98.7 F)   Wt 82.6 kg   BMI 36.14 kg/m    BSA 1.86 m       Physical Exam  BP 130/80   Temp 37.1 C (98.7 F)   Wt 82.6 kg   BMI 36.14 kg/m     General Appearance:    Alert, cooperative, no distress,  Coherent and intelligent   Head:    Normocephalic, without obvious abnormality, atraumatic   Eyes:    PERRL, conjunctiva/corneas clear, EOM's intact, fundi     benign, both eyes   Ears:    Normal TM's and external ear canals, both ears   Nose:   Nares normal, septum midline, mucosa normal, no drainage     or sinus tenderness   Throat:   Lips, mucosa, and tongue normal; teeth and gums normal   Neck:   Supple, symmetrical, trachea midline, no adenopathy;     thyroid:  no enlargement/tenderness/nodules; no carotid    bruit or JVD   Back:     Symmetric, no curvature, ROM normal, no CVA tenderness  Lungs:     Clear to auscultation bilaterally, respirations unlabored   Chest Wall:    No tenderness or deformity    Heart:    Regular rate and rhythm, S1 and S2 normal, no murmur, rub    or gallop   Breast Exam:       Abdomen:     Soft, non-tender, bowel sounds active all four quadrants,     no masses, no organomegaly   Genitalia:     Rectal:     Extremities:   Extremities normal, atraumatic, no cyanosis or edema   Pulses:   2+ and symmetric all extremities   Skin:   Skin color, texture, turgor normal, no rashes or lesions   Lymph nodes:   Cervical, supraclavicular, and axillary nodes normal   Neurologic:   CNII-XII intact.  Mild tremor     Assessment/Plan   Khylin was seen today for constant depression.  Depression with anxiety  -     TSH; Future  -     citalopram (CeleXA) 20 mg tablet; Take 1 tablet (20 mg) by mouth once daily.  Essential hypertension (CMS-HCC)  -     CBC and differential - WAM and non-WAM; Future  -     Basic metabolic panel; Future  Tinnitus of both ears  History of acoustic neuroma  Acoustic neuroma (Multi-HCC)  Chronic GERD  Paresthesia  -     Vitamin B12; Future  Vitamin D deficiency  -     Vitamin D 25 hydroxy; Future  Hyperlipidemia,  unspecified hyperlipidemia type (CMS-HCC)  -     Hepatic function panel; Future      Main issue is severe depression.    Start SSRI.     Has been referred to psychiatry/counselling

## 2023-04-06 LAB — HEPATIC FUNCTION PANEL
ALT: 26 IU/L (ref 0–32)
AST: 26 IU/L (ref 0–40)
Albumin: 4.7 g/dL (ref 3.8–4.8)
Alk Phosphatase: 115 IU/L (ref 44–121)
Bili Total: 0.6 mg/dL (ref 0.0–1.2)
Bilirubin, Direct: 0.15 mg/dL (ref 0.00–0.40)
Protein Total: 7.3 g/dL (ref 6.0–8.5)

## 2023-04-06 LAB — TSH: TSH: 1.01 u[IU]/mL (ref 0.450–4.500)

## 2023-04-06 LAB — CBC W/DIFF
Baso Abs: 0.1 10*3/uL (ref 0.0–0.2)
Basos: 1 %
Eos Abs: 0.1 10*3/uL (ref 0.0–0.4)
Eos: 1 %
Hct: 47 % — ABNORMAL HIGH (ref 34.0–46.6)
Hgb: 15.7 g/dL (ref 11.1–15.9)
Immature Grans Abs: 0 10*3/uL (ref 0.0–0.1)
Immature Granulocytes: 0 %
Lymphs Abs: 1.9 10*3/uL (ref 0.7–3.1)
Lymphs: 34 %
MCH: 28.9 pg (ref 26.6–33.0)
MCHC: 33.4 g/dL (ref 31.5–35.7)
MCV: 86 fL (ref 79–97)
Monocytes: 8 %
MonocytesAbs: 0.4 10*3/uL (ref 0.1–0.9)
Neutrophils Abs: 3 10*3/uL (ref 1.4–7.0)
Neutrophils: 56 %
Platelets: 284 10*3/uL (ref 150–450)
RBC: 5.44 x10E6/uL — ABNORMAL HIGH (ref 3.77–5.28)
RDW: 12.6 % (ref 11.7–15.4)
WBC: 5.4 10*3/uL (ref 3.4–10.8)

## 2023-04-06 LAB — BASIC METABOLIC PANEL
Anion Gap: 18 mmol/L (ref 10.0–18.0)
BUN/Creat Ratio: 10 — ABNORMAL LOW (ref 12–28)
BUN: 10 mg/dL (ref 8–27)
Calcium: 9.7 mg/dL (ref 8.7–10.3)
Carbon Dioxide: 21 mmol/L (ref 20–29)
Chloride: 106 mmol/L (ref 96–106)
Creat: 1.02 mg/dL — ABNORMAL HIGH (ref 0.57–1.00)
Glucose: 101 mg/dL — ABNORMAL HIGH (ref 70–99)
Potassium: 4 mmol/L (ref 3.5–5.2)
Sodium: 145 mmol/L — ABNORMAL HIGH (ref 134–144)
eGFR: 57 mL/min/{1.73_m2} — ABNORMAL LOW (ref >59)

## 2023-04-06 LAB — VITAMIN B12: Vitamin B12: 1034 pg/mL (ref 232–1245)

## 2023-04-06 LAB — VITAMIN D 25 HYDROXY: Vitamin D, 25-Hydroxy: 50 ng/mL (ref 30.0–100.0)

## 2023-04-06 MED ORDER — citalopram (CeleXA) 20 mg tablet
20 | ORAL_TABLET | Freq: Every day | ORAL | 1 refills | 90.00000 days | Status: AC
Start: 2023-04-06 — End: 2023-06-05

## 2023-04-06 MED ORDER — methylphenidate (Ritalin) 5 mg tablet
5 | ORAL_TABLET | Freq: Every day | ORAL | 0 refills | Status: DC
Start: 2023-04-06 — End: 2023-04-11

## 2023-04-06 NOTE — Telephone Encounter (Signed)
The pharmacy is stating they did not rec it

## 2023-04-06 NOTE — Telephone Encounter (Signed)
resent

## 2023-04-11 MED ORDER — methylphenidate (Ritalin) 5 mg tablet
5 | ORAL_TABLET | Freq: Every day | ORAL | 0 refills | Status: AC
Start: 2023-04-11 — End: ?

## 2023-04-11 NOTE — Telephone Encounter (Signed)
From: [facility]  To: Dr. Electa Sniff, MD  Sent: 04/11/2023 10:34 AM EDT  Subject: methylphenidate 5. mg    Good morning,   [facility] has not yet received the information they requested from you in order to refill my mother's prescription for methylphenidate 5 mg. You sent the prescription on September 11th, but I guess they need the date she was last seen in the office to complete the prescription.  Thank you,  Gregor Hams

## 2023-04-14 NOTE — Patient Instructions (Signed)
Per Steward Drone at the [facility] - they need an order for the sleep study test and for the sleep consult.

## 2023-04-26 ENCOUNTER — Ambulatory Visit: Admit: 2023-04-26 | Discharge: 2023-04-26 | Payer: MEDICARE | Primary: Family Medicine

## 2023-04-26 DIAGNOSIS — G4730 Sleep apnea, unspecified: Secondary | ICD-10-CM

## 2023-04-26 NOTE — Progress Notes (Signed)
Patient was given verbal and written instructions for the use of the WatchPat. Patient verbalized understanding of proper use of device. Patient was instructed to follow up with MD in 4-5 weeks for results.  Epworth:  Sitting and reading: Would never doze  Watching TV: Would never doze  Sitting, inactive in a public place (e.g. a theatre or a meeting): Would never doze  As a passenger in a car for an hour without a break: Would never doze  Lying down to rest in the afternoon when circumstances permit: Would never doze  Sitting and talking to someone: Would never doze  Sitting quietly after a lunch without alcohol: Would never doze  In a car, while stopped for a few minutes in traffic: Would never doze  Total score: 0

## 2023-06-08 ENCOUNTER — Telehealth: Admit: 2023-06-08 | Payer: MEDICARE | Primary: Family Medicine

## 2023-06-08 DIAGNOSIS — G4733 Obstructive sleep apnea (adult) (pediatric): Secondary | ICD-10-CM

## 2023-06-08 NOTE — Progress Notes (Signed)
SLEEP MEDICINE CONSULTATION/TELEHEALTH VISIT    This real-time, interactive virtual Telehealth encounter was done by video with the patient's verbal consent. Two patient identifiers were used and confirmed. Physical location of the patient: home Patient resides in: [place]  Physical location of the provider: office. Other participants/involvement: none  Total minutes spent: 30  _____________________________________________________________  Date of Visit: 06/08/2023    Patient: Hannah [name]  DOB: 03/18/1946   MRN#: [id number]  PCP: Cheryle Horsfall, MD    Reason For Visit: Mild to moderate OSA, WatchPAT results    History of Present Illness: Hannah [name] is a 77 y.o. female with medical history of HTN, GERD, acoustic neuroma, depression, anxiety, vit D deficiency, HLD who presents for evaluation of OSA. Part of the history is taken from previous medical records reviewing notes, images and laboratory values.    WatchPAT 04/26/23 showed evidence of mild to moderate OSA with total 4% AHI 12.4/h, supine AHI 16.2/h, non-supine AHI 4.3/h, REM AHI 16.9/h, CAI 0/h, O2 nadir 83% and at or below 88% for a total of 37.8 minutes. She originally underwent this sleep study with concerns of poor energy, poor sleep quality, and thinking that she had sleep apnea. However, she was in a "deep depression" for 5-6 months including at the time of doing this WatchPAT. She is now "out of the depression" and feels "100% better now."     SLEEP/WAKE:  Bedtime: 11PM  Wake time: 8AM  Sleep-onset latency: 5-10 minutes  Bedroom partner: Husband  Use of hypnotic: Quetiapine, trazodone 25mg   Awakenings from sleep: 2x/night  Difficulty falling back to sleep: No  Refreshed upon waking: Yes    SLEEP-RELATED SYMPTOMS:  Snoring: Yes  Witnessed apneas: Yes  Gasping/Choking: Yes  Dry mouth: Yes  Morning HA: No    DAYTIME SYMPTOMS:  Excessive daytime sleepiness: No  Daytime fatigue/tiredness: No  Drowsiness when driving: No  Naps: No     SLEEP-RELATED  BEHAVIORS  RLS: Negative for evening leg sensations  Parasomnias: Negative for sleep walking, talking, nightmares, night terrors  RBD Symptoms: Negative for kicking, yelling, or punching during sleep  Narcolepsy: Negative for cataplexy, sleep paralysis, hypnogogic/hypnopompic hallucinations    SOCIAL HISTORY:  Caffeine: 1 cup coffee daily  Tobacco: No  Alcohol: No  Other substances: No  Occupation/employment: Retired  Weight: Lost 20lbs in the last 6 months    Epworth Sleepiness Score (0-24):     Review of Systems:   Denies pertinent ROS except as noted in HPI.     Previous Sleep Studies: See above    Current Outpatient Medications   Medication Instructions    atorvastatin (LIPITOR) 20 mg, oral, Daily    buPROPion XL (WELLBUTRIN XL) 300 mg, oral, Every morning    citalopram (CELEXA) 20 mg, oral, Daily    losartan (COZAAR) 50 mg, oral, Daily    methylphenidate (RITALIN) 5 mg, oral, Daily before breakfast, Last office visit 04/05/2023.    omeprazole (PRILOSEC) 20 mg, oral, Daily before breakfast, Do not crush or chew.     QUEtiapine XR (SEROQUEL XR) 100 mg, oral, Nightly    ZOLMitriptan (ZOMIG) 5 mg, oral, Once as needed, May repeat in 2 hours if unresolved. Do not exceed 10 mg in 24 hours.        Allergies   Allergen Reactions    Codeine     Eucalyptus     Sulfa (Sulfonamide Antibiotics) Rash       Past Medical History:   Diagnosis Date  Acoustic neuroma (Multi-HCC)     Chronic GERD     Depression with anxiety     Essential hypertension (CMS-HCC)     Family hx of colon cancer     Hyperlipidemia (CMS-HCC)     Lichen sclerosus     Osteopenia determined by x-ray     Vitamin D deficiency        Patient Active Problem List    Diagnosis Date Noted    Acoustic neuroma (Multi-HCC) 12/27/2020    Chronic GERD 12/27/2020    Depression with anxiety 12/27/2020    Essential hypertension (CMS-HCC) 12/27/2020    Family hx of colon cancer 12/27/2020    Hyperlipidemia (CMS-HCC) 12/27/2020    Lichen sclerosus 12/27/2020    Osteopenia  determined by x-ray 12/27/2020    Vitamin D deficiency 12/27/2020    Back problem 12/27/2020    Displacement of lumbar intervertebral disc without myelopathy 12/27/2020    Low back pain 12/27/2020    Plantar fasciitis 12/27/2020    Spinal stenosis of lumbar region 12/27/2020    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   General appearance: No acute distress. Pleasant and cooperative with history and examination.  Head: Normocephalic, atraumatic  Ear, Nose, and Throat: Mallampati class unable to determine virtually   Neck: Supple.    Lungs: Breathing comfortable on room air.  Extremities: No appreciable edema.   Neurological: Alert, attentive, and cooperative. Normal language output and comprehension.   Psychiatric: Normal mood and affect.    Assessment and Plan:     1. OSA (obstructive sleep apnea)      Hannah [name] is a pleasant 77 y.o. female with medical history as noted above who presents for evaluation of OSA    #OSA, mild to moderate: We reviewed pathophysiology of OSA and potential complications if untreated. Reviewed WatchPAT results in detail and discussed that WatchPAT often underestimates true AHI.  Reviewed various treatment options.  Recommend CPAP which patient is amenable to. Rx sent to [facility].  We will plan to follow up in a few months to review response to therapy and compliance data.    #Sleep Hygiene: Bedroom should be kept cool and dark and used only for sleeping. No screentime for at least 30 to 60 minutes before bed. Limit caffeine  consumption, particularly after noon. Avoid nicotine and alcohol before bed. Avoid eating late. If cannot fall asleep within 20 minutes, get out of bed and do something relaxing and distracting in another room. Return to bed only when sleepy and repeat as needed. Wake up at the same time every day.  Get early morning sunlight when possible. Encouraged cardiovascular exercise daily for 20-30 minutes, but avoid strenuous exercise close to bedtime.     #Avoid drowsy driving.  If sleepy while driving, patient should pull over, take a nap, and resume driving when refreshed and alert.     #  Patient to reach out via phone or MyChart with any questions or concerns in the interim.     #Follow up: 4 months  ______________________    Jackey Loge, PA-C  [facility]  [facility]

## 2023-06-16 NOTE — Telephone Encounter (Signed)
Rx sent 

## 2023-08-03 NOTE — Telephone Encounter (Signed)
Hannah [name] called for the following refill:    Requested Prescriptions     Pending Prescriptions Disp Refills    buPROPion XL (Wellbutrin XL) 300 mg 24 hr tablet       Sig: Take 1 tablet (300 mg) by mouth in the morning.      08/03/2023  Pt called again for refill for above medication

## 2023-08-04 MED ORDER — buPROPion XL (Wellbutrin XL) 300 mg 24 hr tablet
300 | ORAL_TABLET | Freq: Every morning | ORAL | 1 refills | Status: AC
Start: 2023-08-04 — End: 2024-01-31

## 2023-08-04 NOTE — Telephone Encounter (Signed)
 See other note

## 2023-08-04 NOTE — Telephone Encounter (Signed)
Please see TE to u on this, is filled by anther provider they can not reach- is looking for refill

## 2023-08-11 NOTE — Telephone Encounter (Signed)
Has rhinovirus and thinks she has COVID. Can't use CPAP because nose is "completely blocked." She is concerned about compliance if not using the machine nightly. Discussed compliance requirements. She received the machine 08/02/22. Advised her to resume use of CPAP when tolerable when congestion improves, and she knows how to meet compliance by the end of the 90 days. Asked patient to call back with questions or concerns.

## 2023-08-11 NOTE — Telephone Encounter (Signed)
Patient called-shes been ill and stopped using her machine for the last 3 days shes worried that she will lose coverage, sent email to [facility] and Minerva Areola

## 2023-08-16 MED ORDER — omeprazole (PriLOSEC) 20 mg DR capsule
20 | ORAL_CAPSULE | Freq: Every day | ORAL | 1 refills | 60.00000 days | Status: AC
Start: 2023-08-16 — End: 2024-02-12

## 2023-08-19 MED ORDER — losartan (Cozaar) 50 mg tablet
50 | ORAL_TABLET | Freq: Every day | ORAL | 1 refills | 90.00000 days | Status: DC
Start: 2023-08-19 — End: 2023-10-18

## 2023-08-24 ENCOUNTER — Ambulatory Visit: Admit: 2023-08-24 | Discharge: 2023-08-24 | Payer: MEDICARE | Attending: Physician Assistant | Primary: Family Medicine

## 2023-08-24 ENCOUNTER — Inpatient Hospital Stay: Payer: MEDICARE | Primary: Family Medicine

## 2023-08-24 DIAGNOSIS — R051 Acute cough: Secondary | ICD-10-CM

## 2023-08-24 NOTE — Progress Notes (Signed)
[facility]  [address]  [address]  [place] [address]  Dept: [phone]  Dept Fax: [phone]     Patient ID: Hannah [name] is a 78 y.o. female who presents for Follow-up (Daughter had COVID, returning with some symptoms, small cough with flem ).    Subjective   HPI  Pt with exposure to COVID 3 weeks ago by dtr. She also had exposure to pneumonia from son in law. She has had productive cough for 1.5 weeks, stuffiness, scratchy/ST    Started with CPAP machine 3-4 weeks ago. Stopped after getting sick and then started using this again last night. Had no worsening sx with restart.    Denies fever, chills, body aches. Just wanted to get checked out      Objective   Visit Vitals  BP 124/78   Pulse 87   Temp 37 C (98.6 F)   Wt 77.2 kg   SpO2 95%   BMI 33.80 kg/m   BSA 1.8 m     Physical Exam  Constitutional:       Appearance: Normal appearance.   HENT:      Head: Normocephalic.   Eyes:      Extraocular Movements: Extraocular movements intact.   Cardiovascular:      Rate and Rhythm: Normal rate and regular rhythm.      Pulses: Normal pulses.      Heart sounds: Normal heart sounds.   Pulmonary:      Effort: Pulmonary effort is normal.      Breath sounds: Wheezing present. No rhonchi or rales.   Musculoskeletal:      Cervical back: Normal range of motion and neck supple.   Neurological:      Mental Status: She is alert. Mental status is at baseline.   Psychiatric:         Mood and Affect: Mood normal.         Behavior: Behavior normal.         Assessment/Plan     1. Acute cough  - XR CHEST 2 VIEWS; Future  2. Wheeze  - XR CHEST 2 VIEWS; Future  Pt with exposure to COVID 3 weeks ago by dtr. She also had exposure to pneumonia from son in law. She has had productive cough for 1.5 weeks, stuffiness, scratchy/ST    Started with CPAP machine 3-4 weeks ago. Stopped after getting sick and then started using this again last night. Had no worsening sx with restart.    Denies fever, chills, body aches. Just  wanted to get checked out  No systemic sx, speaking in full sentences, no respiratory distress, O2 95% through out.  No acute distress, will get CXR to make sure no consolidation on R side given wheeze. Would tx with abx if needed.

## 2023-08-25 ENCOUNTER — Inpatient Hospital Stay: Admit: 2023-08-25 | Payer: MEDICARE | Primary: Family Medicine

## 2023-08-25 DIAGNOSIS — R051 Acute cough: Secondary | ICD-10-CM

## 2023-09-15 NOTE — Telephone Encounter (Signed)
Spoke with patient. She has been coughing for a while, nose congested. Daughter had COVID and she believes she has/had COVID. Can't use CPAP currently. Was able to use it, then had to stop due to the cough and congestion. Can "somewhat" tolerate the machine when not congested. She is concerned about compliance. Reviewed compliance requirements. Encouraged patient focus on improving/treating cough and congestion which will ultimately allow for CPAP to be more tolerable. If not compliant, we could repeat a diagnostic sleep study to re-qualify for another trial of CPAP.

## 2023-09-26 ENCOUNTER — Ambulatory Visit: Admit: 2023-09-26 | Discharge: 2023-09-26 | Payer: MEDICARE | Primary: Family Medicine

## 2023-09-26 DIAGNOSIS — G4733 Obstructive sleep apnea (adult) (pediatric): Secondary | ICD-10-CM

## 2023-09-26 NOTE — Progress Notes (Signed)
 SLEEP MEDICINE CONSULTATION/TELEHEALTH VISIT    This real-time, interactive virtual Telehealth encounter was done by video with the patient's verbal consent. Two patient identifiers were used and confirmed. Physical location of the patient: home Patient resides in: [place]  Physical location of the provider: office. Other participants/involvement: none  Total minutes spent: 25  _____________________________________________________________  Date of Visit: 09/26/2023    Patient: Hannah [name]  DOB: 09-24-1945   MRN#: [id number]  PCP: Cheryle Horsfall, MD    Reason For Visit: Mild to moderate OSA on CPAP    History of Present Illness: [name] is a 78 y.o. female with medical history of HTN, GERD, acoustic neuroma, depression, anxiety, vit D deficiency, HLD who presents for evaluation of OSA. Part of the history is taken from previous medical records reviewing notes, images and laboratory values.    Background History: WatchPAT 04/26/23 showed evidence of mild to moderate OSA with total 4% AHI 12.4/h, supine AHI 16.2/h, non-supine AHI 4.3/h, REM AHI 16.9/h, CAI 0/h, O2 nadir 83% and at or below 88% for a total of 37.8 minutes. She originally underwent this sleep study with concerns of poor energy, poor sleep quality, and thinking that she had sleep apnea. However, she was in a "deep depression" for 5-6 months including at the time of doing this WatchPAT. She is now "out of the depression" and feels "100% better now."     Interval History: Patient has been experiencing some difficulty using CPAP consistently.  She came down with a cold recently and had some significant congestion and had trouble tolerating the pressures because of this congestion.  She now feels better.  However, she has some questions about cleaning the machine and if she has not of time and her compliance.  Can be compliance.  She is using a nasal interface which works fairly well. She did speak with someone at [facility] last week.      SLEEP/WAKE:  Bedtime: 11PM  Wake time: 8AM  Sleep-onset latency: 5-10 minutes  Bedroom partner: Husband  Use of hypnotic: Quetiapine, trazodone 25mg   Awakenings from sleep: 2x/night  Difficulty falling back to sleep: No  Refreshed upon waking: Yes    SLEEP-RELATED SYMPTOMS:  Snoring: Yes  Witnessed apneas: Yes  Gasping/Choking: Yes  Dry mouth: Yes  Morning HA: No    Epworth Sleepiness Score (0-24):     Review of Systems:   Denies pertinent ROS except as noted in HPI.     Previous Sleep Studies: See above    CPAP usage data:      Current Outpatient Medications   Medication Instructions    atorvastatin (LIPITOR) 20 mg, oral, Daily    buPROPion XL (WELLBUTRIN XL) 300 mg, oral, Every morning    citalopram (CELEXA) 20 mg, oral, Daily    losartan (COZAAR) 50 mg, oral, Daily    omeprazole (PRILOSEC) 20 mg, oral, Daily before breakfast, Do not crush or chew.     QUEtiapine XR (SEROQUEL XR) 100 mg, oral, Nightly    traZODone (Desyrel) 50 mg tablet oral, Nightly, 1/2 every night    ZOLMitriptan (ZOMIG) 5 mg, oral, Once as needed, May repeat in 2 hours if unresolved. Do not exceed 10 mg in 24 hours.        Allergies   Allergen Reactions    Codeine     Eucalyptus     Sulfa (Sulfonamide Antibiotics) Rash       Past Medical History:   Diagnosis Date    Acoustic neuroma (  Multi-HCC)     Chronic GERD     Depression with anxiety     Essential hypertension     Family hx of colon cancer     Hyperlipidemia     Lichen sclerosus     Osteopenia determined by x-ray     Vitamin D deficiency        Patient Active Problem List    Diagnosis Date Noted    Acoustic neuroma (Multi-HCC) 12/27/2020    Chronic GERD 12/27/2020    Depression with anxiety 12/27/2020    Essential hypertension 12/27/2020    Family hx of colon cancer 12/27/2020    Hyperlipidemia 12/27/2020    Lichen sclerosus 12/27/2020    Osteopenia determined by x-ray 12/27/2020    Vitamin D deficiency 12/27/2020    Back problem 12/27/2020    Displacement of lumbar intervertebral  disc without myelopathy 12/27/2020    Low back pain 12/27/2020    Plantar fasciitis 12/27/2020    Spinal stenosis of lumbar region 12/27/2020    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   General appearance: No acute distress. Pleasant and cooperative with history and examination.  Head: Normocephalic, atraumatic  Ear, Nose, and Throat: Mallampati class unable to determine virtually   Neck: Supple.    Lungs: Breathing comfortable on room air.  Extremities: No appreciable edema.   Neurological: Alert, attentive, and cooperative. Normal language output and comprehension.   Psychiatric: Normal mood and affect.    Assessment and Plan:     1. OSA (obstructive sleep apnea)      Ms. [name] is a pleasant 78 y.o. female with medical history as noted above who presents for evaluation of OSA    #OSA, mild to moderate: Patient has been using and benefiting from CPAP, and I recommend continued use.  We reviewed CPAP usage data in detail which shows therapeutic residual AHI of 2.3/ with acceptable mask leak. Usage has been inconsistent primarily due to upper respiratory illness recently. She was able to use the machine for a about a week with good usage times and AHI of 2/h. She had some questions regarding cleaning the machine parts which we reviewed today.  She is motivated to use CPAP. I notified DME that she plans to continue use and to meet compliance. We will plan to f/up in a couple of months to review usage data and compliance to therapy. I have asked patient to reach out sooner  with questions or concerns.     #Sleep Hygiene: Bedroom should be kept cool and dark and used only for sleeping. No screentime for at least 30 to 60 minutes before bed. Limit caffeine consumption, particularly after noon. Avoid nicotine and alcohol before bed. Avoid eating late. If cannot fall asleep within 20 minutes, get out of bed and do something relaxing and distracting in another room. Return to bed only when  sleepy and repeat as needed. Wake up at the same time every day.  Get early morning sunlight when possible. Encouraged cardiovascular exercise daily for 20-30 minutes, but avoid strenuous exercise close to bedtime.     #Avoid drowsy driving.  If sleepy while driving, patient should pull over, take a nap, and resume driving when refreshed and alert.     #Patient to reach out via phone or MyChart with any questions or concerns in the interim.     #Follow up: 3 months  ______________________    Jackey Loge, PA-C  [facility]  [facility]

## 2023-09-30 NOTE — Telephone Encounter (Signed)
 Patient called in with a question of pressure settings. She was wondering why the pressures increased overnight. Discussed her current settings and the difference between treatment pressures overnight and the initial ramp period which starts at [facility]. She expressed understanding. Pressures have been comfortable. Making a good effort to use the machine. Last two nights averaged over 9h usage with AHI 3.2/h.

## 2023-11-10 ENCOUNTER — Inpatient Hospital Stay
Admit: 2023-11-10 | Disposition: A | Discharge status: Home / Self Care | Payer: MEDICARE | Attending: Family Medicine | Primary: Family Medicine

## 2023-11-10 DIAGNOSIS — R197 Diarrhea, unspecified: Secondary | ICD-10-CM

## 2023-11-10 NOTE — Discharge Instructions (Addendum)
 monitor symptoms.  return to clinic if needed.   go to ER if worsening as discussed.

## 2023-11-10 NOTE — ED Provider Notes (Signed)
 [facility]  [address] [place] [address]    History  Chief Complaint   Patient presents with    Diarrhea     Pt reports liquid loose stools and abdominal cramps for 1 month. She states stools 4 to 5 times at night as well while she is sleeping.     3 to 4 weeks of diarrhea  -Up to 2-3 times a day  -Loose  -Yellow in color  Not feeling sick  No other associated symptoms  Started new medication about a month ago        History provided by:  Patient  Language interpreter used: No      Problem List[1]    Medical History[2]    Surgical History[3]    Family History[4]    Social History     Tobacco Use    Smoking status: Never    Smokeless tobacco: Never   Vaping Use    Vaping status: Never Used   Substance Use Topics    Alcohol use: Never    Drug use: Never       Review of Systems   Constitutional:  Negative for chills, diaphoresis, fatigue and fever.   HENT:  Negative for congestion, ear discharge, facial swelling, rhinorrhea, sinus pressure, sore throat and trouble swallowing.    Eyes:  Negative for discharge, redness and visual disturbance.   Respiratory:  Negative for cough and shortness of breath.    Cardiovascular:  Negative for chest pain.   Gastrointestinal:  Positive for diarrhea. Negative for abdominal pain, nausea and vomiting.   Genitourinary:  Negative for dysuria.   Skin:  Negative for color change and rash.   Neurological:  Negative for dizziness, facial asymmetry, speech difficulty, numbness and headaches.   Psychiatric/Behavioral:  Negative for agitation and confusion.        Physical Exam  Vitals:    11/10/23 1319   BP: 105/72   Pulse: 76   Resp: 18   Temp: 36.7 C (98 F)   SpO2: 96%   Weight: 77.1 kg   Height: 1.499 m       Physical Exam  Vitals and nursing note reviewed.   Constitutional:       General: She is not in acute distress.     Appearance: Normal appearance. She is well-developed. She is not ill-appearing.   HENT:      Head: Atraumatic.      Right Ear:  External ear normal.      Left Ear: External ear normal.      Nose: Nose normal.      Mouth/Throat:      Mouth: Mucous membranes are moist.   Eyes:      General:         Right eye: No discharge.         Left eye: No discharge.      Extraocular Movements: Extraocular movements intact.      Conjunctiva/sclera: Conjunctivae normal.      Pupils: Pupils are equal, round, and reactive to light.   Cardiovascular:      Rate and Rhythm: Normal rate and regular rhythm.      Heart sounds: No murmur heard.  Pulmonary:      Effort: Pulmonary effort is normal. No respiratory distress.      Breath sounds: Normal breath sounds. No wheezing, rhonchi or rales.   Abdominal:      General: There is no distension.   Musculoskeletal:  General: Normal range of motion.      Cervical back: Normal range of motion and neck supple.   Skin:     General: Skin is warm.   Neurological:      General: No focal deficit present.      Mental Status: She is alert.   Psychiatric:         Mood and Affect: Mood normal.              No orders to display     Labs Reviewed - No data to display    Procedures  Procedures    UC Course  Diagnoses as of 11/10/23 1357   Diarrhea, unspecified type       Medical Decision Making      Pt stable.    Diarrhea  -Mild symptoms  -Unlikely infection or C. Difficile  -New medication a month ago  -Likely the medication side effect  - Advised to speak with PCP or prescriber    No concerning signs on exam at this time.  discussed expected course of illness.  NO: f/c/s/cp/sob/headache/dizziness/vision changes/n/v/abd pain/numbness/tingling/weakness/infection  Concerning signs that should alert patient to seek immediate medical care reviewed with patient    f/u w/ pcp.  rtc if needed.  ER If worsening.      Problems Addressed:  Diarrhea, unspecified type: acute illness or injury        Discharge Meds  ED Prescriptions    None         Home Meds  Prior to Admission medications    Medication Sig Start Date End Date Taking?  Authorizing Provider   atorvastatin (Lipitor) 20 mg tablet Take 1 tablet by mouth once daily 09/13/23  Yes Milagros Alf, MD   buPROPion XL (Wellbutrin XL) 300 mg 24 hr tablet Take 1 tablet (300 mg) by mouth in the morning. 08/04/23 01/31/24 Yes Milagros Alf, MD   losartan (Cozaar) 50 mg tablet Take 1 tablet by mouth once daily 10/18/23  Yes Milagros Alf, MD   omeprazole (PriLOSEC) 20 mg DR capsule Take 1 capsule (20 mg) by mouth before breakfast. Do not crush or chew. 08/16/23 02/12/24 Yes Milagros Alf, MD   traZODone (Desyrel) 50 mg tablet Take by mouth at bedtime. 1/2 every night 05/05/23  Yes Historical Provider, MD   calcium carbonate-vitamin D3 600 mg-10 mcg (400 unit) tablet Take 1 tablet by mouth once daily.    Nurse [facility], RN   citalopram (CeleXA) 20 mg tablet Take 1 tablet (20 mg) by mouth once daily. 04/06/23 08/24/23  Milagros Alf, MD   QUEtiapine XR (SEROquel XR) 50 mg 24 hr tablet Take 2 tablets (100 mg) by mouth at bedtime. 09/13/22 08/24/23  Milagros Alf, MD   sertraline (Zoloft) 50 mg tablet Take 50 mg by mouth once daily.    Nurse [facility], RN   ZOLMitriptan (Zomig) 5 mg tablet Take 1 tablet (5 mg) by mouth 1 (one) time if needed for migraine. May repeat in 2 hours if unresolved. Do not exceed 10 mg in 24 hours. 08/12/22   Milagros Alf, MD         Patient encounter note may have been created using voice recognition software and in real time during the office visit. Please excuse any typographical errors that may not have been edited out.              [1]   Patient Active Problem List  Diagnosis  Acoustic neuroma (Multi-HCC)    Chronic GERD    Depression with anxiety    Essential hypertension     Family hx of colon cancer    Hyperlipidemia     Lichen sclerosus    Osteopenia determined by x-ray    Vitamin D  deficiency    Back problem    Degeneration of lumbosacral intervertebral disc    Displacement of lumbar intervertebral disc without myelopathy    Low back pain    Pain of  left lower extremity    Plantar fasciitis    Sciatica    Spinal stenosis of lumbar region    Trochanteric bursitis of left hip   [2]   Past Medical History:  Diagnosis Date    Acoustic neuroma (Multi-HCC)     Chronic GERD     Depression with anxiety     Essential hypertension      Family hx of colon cancer     Hyperlipidemia      Lichen sclerosus     Osteopenia determined by x-ray     Vitamin D  deficiency    [3]   Past Surgical History:  Procedure Laterality Date    ADENOIDECTOMY      APPENDECTOMY      RECTAL SURGERY  1973    rectal fissurectomies    TONSILLECTOMY     [4]   Family History  Problem Relation Name Age of Onset    Colon cancer Mother          Kathryne Parisian, MD  11/10/23 1358

## 2023-11-15 MED ORDER — omeprazole (PriLOSEC) 20 mg DR capsule
20 | ORAL_CAPSULE | Freq: Every day | ORAL | 1 refills | 60.00000 days | Status: AC
Start: 2023-11-15 — End: 2024-05-13

## 2023-11-22 NOTE — Telephone Encounter (Signed)
 Dr Velna Ghee requesting last OV note with med list patient is in the office currently.  Fax to [phone]

## 2023-12-01 NOTE — Telephone Encounter (Signed)
 Please put order in

## 2023-12-15 ENCOUNTER — Ambulatory Visit: Admit: 2023-12-15 | Discharge: 2023-12-15 | Payer: MEDICARE | Attending: Family Medicine | Primary: Family Medicine

## 2023-12-15 DIAGNOSIS — R053 Chronic cough: Secondary | ICD-10-CM

## 2023-12-15 MED ORDER — traZODone (Desyrel) 50 mg tablet
50 | ORAL_TABLET | Freq: Every evening | ORAL | 0 refills | 30.00000 days | Status: AC
Start: 2023-12-15 — End: ?

## 2023-12-15 NOTE — Progress Notes (Signed)
 [facility]  [address]  [address]  [place] [address]  Dept: [phone]  Dept Fax: [phone]     Patient ID: Hannah [name] is a 78 y.o. female who presents for No chief complaint on file..    Subjective   HPI  Here for annual visit.   Long hx of depression.  Better lately on pristiq and wellbutrin.   Tx for htn, hld. Had acoustic neuroma with left hearing loss.  Hx of GERD on long term PPI.  Has had spasmotic cough for 6 months.  No smoking hx or hx of asthma.    Some nasal congestion and PNdrip.    No hx of CAD,  no DM.     Left hip pain.  More active lately.   Cares for husband with dementia.     Current Outpatient Medications   Medication Instructions    atorvastatin (LIPITOR) 20 mg, oral, Daily    calcium carbonate-vitamin D3 600 mg-10 mcg (400 unit) tablet 1 tablet, Daily    desvenlafaxine (PRISTIQ) 100 mg, Daily    losartan (COZAAR) 50 mg, oral, Daily    omeprazole (PRILOSEC) 20 mg, oral, Daily before breakfast, Do not crush or chew.     traZODone (DESYREL) 50 mg, oral, Nightly, 1/2 every night    ZOLMitriptan (ZOMIG) 5 mg, oral, Once as needed, May repeat in 2 hours if unresolved. Do not exceed 10 mg in 24 hours.   All:  sulfa   soc:  no smoking,  no alcohol.    Daughter lives attached.     Fam hx:   grand daughter with hemachromatisis  Review of Systems   Constitutional: Negative.    HENT:  Positive for congestion and hearing loss.    Eyes: Negative.    Respiratory:  Positive for cough.    Cardiovascular: Negative.    Gastrointestinal: Negative.    Genitourinary: Negative.    Musculoskeletal:  Positive for joint pain.   Skin: Negative.    Neurological:  Positive for headaches.   Psychiatric/Behavioral: Negative.          Objective   Visit Vitals  BP 122/80   Temp 37.1 C (98.7 F)   Ht 1.524 m   Wt 73 kg   BMI 31.44 kg/m   BSA 1.76 m     Physical Exam  BP 122/80   Temp 37.1 C (98.7 F)   Ht 1.524 m   Wt 73 kg   BMI 31.44 kg/m     General Appearance:    Alert, cooperative, no  distress, appears stated age. overweight   Head:    Normocephalic, without obvious abnormality, atraumatic   Eyes:    PERRL, conjunctiva/corneas clear, EOM's intact, fundi     benign, both eyes   Ears:    Normal TM's and external ear canals, both ears. Hearing loss on left    Nose:   Nares normal, septum midline, mucosa normal, no drainage     or sinus tenderness   Throat:   Lips, mucosa, and tongue normal; teeth and gums normal   Neck:   Supple, symmetrical, trachea midline, no adenopathy;     thyroid:  no enlargement/tenderness/nodules; no carotid    bruit or JVD   Back:     Symmetric, no curvature, ROM normal, no CVA tenderness   Lungs:     Clear to auscultation bilaterally, respirations unlabored   Chest Wall:    No tenderness or deformity    Heart:  Regular rate and rhythm, S1 and S2 normal, no murmur, rub    or gallop   Breast Exam:       Abdomen:     Soft, non-tender, bowel sounds active all four quadrants,     no masses, no organomegaly   Genitalia:     Rectal:     Extremities:   Extremities normal, atraumatic, no cyanosis or edema   Pulses:   2+ and symmetric all extremities   Skin:   Skin color, texture, turgor normal, no rashes or lesions   Lymph nodes:   Cervical, supraclavicular, and axillary nodes normal   Neurologic:   CNII-XII intact, normal strength, sensation and reflexes     throughout      Assessment/Plan   Chronic cough  Comments:  for 6 months.  CXR was OK.   tx for GERD with PPI bid and tx PNdrip with flonase and zyrtec or similra  Depression with anxiety  Comments:  better lately on pristique and wellbutrin  Orders:  -     traZODone (Desyrel) 50 mg tablet; Take 1 tablet (50 mg) by mouth at bedtime. 1/2 every night  Essential hypertension   Comments:  controlled.  check lytes  Orders:  -     Basic metabolic panel; Future  -     CBC and differential - WAM and non-WAM; Future  -     TSH; Future  -     Hemoglobin A1c; Future  -     Urinalysis; Future  History of acoustic  neuroma  Comments:  left hearing loss  Chronic GERD  Comments:  on PPI  Hyperlipidemia, unspecified hyperlipidemia type   Comments:  on statin.  recheck FLP  Orders:  -     Lipid panel; Future  -     Hepatic function panel; Future  Family hx of colon cancer  Comments:  mother had colon cancer.  sees dr Henrey Loffler q 5 years  Family history of hemochromatosis  -     Hemochromatosis mutation; Future  Other hemochromatosis   -     Hemochromatosis mutation; Future  Hyperglycemia  -     Hemoglobin A1c; Future  Spinal stenosis of lumbar region, unspecified whether neurogenic claudication present  Trochanteric bursitis of left hip      Patient Health Questionnaire-9 Score: 0   Interpretation: Negative screening.      Follow-up & Interventions: Maintain annual screening - No additional Follow-up required

## 2023-12-16 LAB — CBC W/DIFF
Baso Abs: 0.1 10*3/uL (ref 0.0–0.2)
Basos: 1 %
Eos Abs: 0.1 10*3/uL (ref 0.0–0.4)
Eos: 2 %
Hct: 45.6 % (ref 34.0–46.6)
Hgb: 14.6 g/dL (ref 11.1–15.9)
Immature Grans Abs: 0 10*3/uL (ref 0.0–0.1)
Immature Granulocytes: 0 %
Lymphs Abs: 1.5 10*3/uL (ref 0.7–3.1)
Lymphs: 29 %
MCH: 28.6 pg (ref 26.6–33.0)
MCHC: 32 g/dL (ref 31.5–35.7)
MCV: 89 fL (ref 79–97)
Monocytes: 10 %
MonocytesAbs: 0.5 10*3/uL (ref 0.1–0.9)
Neutrophils Abs: 3 10*3/uL (ref 1.4–7.0)
Neutrophils: 58 %
Platelets: 309 10*3/uL (ref 150–450)
RBC: 5.11 x10E6/uL (ref 3.77–5.28)
RDW: 12 % (ref 11.7–15.4)
WBC: 5.2 10*3/uL (ref 3.4–10.8)

## 2023-12-16 LAB — HEMOGLOBIN A1C: HgbA1C: 5.7 % — ABNORMAL HIGH (ref 4.8–5.6)

## 2023-12-20 LAB — HEMOCHROMATOSIS MUTATION

## 2023-12-21 LAB — HEPATIC FUNCTION PANEL
ALT: 15 IU/L (ref 0–32)
AST: 27 IU/L (ref 0–40)
Albumin: 4.6 g/dL (ref 3.8–4.8)
Alk Phosphatase: 97 IU/L (ref 44–121)
Bili Total: <0.2 mg/dL (ref 0.0–1.2)
Bilirubin, Direct: <0.08 mg/dL (ref 0.00–0.40)
Protein Total: 7.3 g/dL (ref 6.0–8.5)

## 2023-12-21 LAB — BASIC METABOLIC PANEL
Anion Gap: 24 mmol/L — ABNORMAL HIGH (ref 10.0–18.0)
BUN/Creat Ratio: 14 (ref 12–28)
BUN: 14 mg/dL (ref 8–27)
Calcium: 9.8 mg/dL (ref 8.7–10.3)
Carbon Dioxide: 16 mmol/L — ABNORMAL LOW (ref 20–29)
Chloride: 102 mmol/L (ref 96–106)
Creat: 1.01 mg/dL — ABNORMAL HIGH (ref 0.57–1.00)
Glucose: 84 mg/dL (ref 70–99)
Potassium: 4.2 mmol/L (ref 3.5–5.2)
Sodium: 142 mmol/L (ref 134–144)
eGFR: 58 mL/min/{1.73_m2} — ABNORMAL LOW (ref >59)

## 2023-12-21 LAB — LIPID PANEL
Cholesterol: 169 mg/dL (ref 100–199)
HDL Cholesterol: 69 mg/dL (ref >39)
LDLc Calc (NIH): 84 mg/dL (ref 0–99)
Non-HDL Chol: 100 mg/dL (ref 0–129)
Triglycerides: 89 mg/dL (ref 0–149)
VLDLc Calc: 16 mg/dL (ref 5–40)

## 2023-12-21 LAB — TSH: TSH: 1 u[IU]/mL (ref 0.450–4.500)

## 2024-01-16 ENCOUNTER — Ambulatory Visit: Payer: MEDICARE | Primary: Family Medicine

## 2024-01-16 MED ORDER — triamcinolone (Kenalog) 0.1 % cream
0.1 | Freq: Two times a day (BID) | TOPICAL | 1 refills | Status: AC
Start: 2024-01-16 — End: ?

## 2024-01-16 NOTE — Telephone Encounter (Signed)
 Up all night scratching--she had this before a couple of years back  hands peeled  She states  she has litlle bumps on both hands and feet    Can she get relief?  No appoints available

## 2024-01-17 ENCOUNTER — Inpatient Hospital Stay: Admit: 2024-01-17 | Discharge: 2024-01-17 | Disposition: A | Discharge status: Home / Self Care | Payer: MEDICARE

## 2024-01-17 DIAGNOSIS — L282 Other prurigo: Secondary | ICD-10-CM

## 2024-01-17 MED ORDER — betamethasone dipropionate (Diprosone) 0.05 % ointment
0.05 | Freq: Two times a day (BID) | TOPICAL | 0 refills | Status: AC
Start: 2024-01-17 — End: 2024-01-31

## 2024-01-17 NOTE — Discharge Instructions (Addendum)
 Please read all attached information. Rest, ice, avoid heat and use antihistamines for itch along with the prescribed topical ointment and occlusive dressings as discussed. Monitor for signs of secondary infection as discussed (warmth, redness, red streaking, discharge, swelling, worsening pain or fevers). Keep the area clean, covered avoid submerging underwater. Wash with normal soap/water and avoid alcohol/hydrogen peroxide. Follow up with your PCP and the dermatologist for reevaluation and further management. Return to the ER sooner with any worsening symptoms or concerns as discussed.

## 2024-01-17 NOTE — ED Triage Notes (Signed)
 Pt presents to triage c/o rash to palms of hands and feet.  Sx onset approx four days ago.  Seen by PCP yesterday, rxd Triamcinolone  Acetonide cream which helped initially but now sx persist regardless of cream.

## 2024-01-17 NOTE — Other (Signed)
 Patient Education  Table of Contents   Itchy Skin Blisters (Dyshidrotic Eczema): What to Know   Betamethasone  Cream, Gel, Lotion, or Ointment    To view videos and all your education online visit,  [email or web address].elsevier.[email or web address]  or scan this QR code with your smartphone.  Access to this content will expire in one year.  Itchy Skin Blisters (Dyshidrotic Eczema): What to Know  Dyshidrotic eczema, also called pompholyx, is a type of skin condition. It causes very itchy blisters on the hands and feet. It's more common before age 42, but it can affect people of any age. There's no cure, but treatments can help relieve symptoms.  What are the causes?  The cause of this condition isn't known.  What increases the risk?  You're more likely to get this condition if:   You wash your hands a lot.   You have a personal or family history of eczema, allergies, asthma, or hay fever.   You have allergies to metals like nickel or cobalt.   You work with skin irritants like detergents or cement.   You smoke.   You're often stressed.   You have an immune system condition.  What are the signs or symptoms?  Symptoms may come and go and often affect your hands or feet. Symptoms include:   Severe itching, often before blisters show up.   Blisters that can form suddenly.  ? At first, the blisters may form near the fingertips.  ? In severe cases, blisters may grow to large blister masses.  ? Blisters go away in 2?3 weeks. This is followed by a less itchy and dry phase.   Pain and swelling.   Cracks or long, narrow openings (fissures) in the skin.   Severe dryness.   Ridges on the nails.  How is this diagnosed?  This condition may be diagnosed based on:   Symptoms.   Physical exam.   Medical history.   Skin scrapings to rule out fungal infections.   Testing a swab of fluid for bacteria.   A biopsy. A small piece of skin is removed and checked for infection or to rule out other conditions.   Skin patch tests that use allergen patches on  your back to check for allergic reactions.  You may need to see a skin specialist called a dermatologist. This specialist can help diagnose and treat this condition.  How is this treated?    There's no cure for this condition, but treatment can help relieve symptoms. Your health care provider may suggest:   Avoiding allergens, irritants, or triggers that make your symptoms worse. You may need to:  ? Use different soaps or lotions.  ? Avoid hot weather or places where you'll sweat a lot.  ? Learn stress management techniques, like relaxation and exercise.  ? Follow diet changes that your provider recommends.   Soothing your skin by:  ? Using a clean, damp towel on the affected area.  ? Taking baths with a special salt called aluminum acetate.   Medicines such as:  ? Medicine to lessen itching (antihistamines).  ? Medicine to put on your skin to lessen swelling and irritation (corticosteroid creams or ointments).  ? Immunosuppressant medicines. These are prescribed in severe cases.  ? Antibiotic medicine if there's a skin infection.   Light therapy, also called phototherapy. This is where you put your affected skin under ultraviolet (UV) light to lessen itchiness and inflammation.  Follow these instructions at home:  Bathing and skin care  Wash your skin gently. After bathing or washing your hands, pat your skin dry. Avoid rubbing.   Take off your jewelry before bathing. If your skin under the jewelry stays wet, blisters may form or get worse.   Apply cool compresses as told by your provider. To do this:  ? Soak a clean towel in cool water.  ? Squeeze out the water until the towel is damp.  ? Place the towel on the affected skin for 20 minutes, 2?3 times a day. Let the water partially air dry, and then put on lotion.   Use mild soaps, cleaners, and lotions that don't contain dyes, perfumes, or irritants.   Keep your skin hydrated. To do this:  ? Take warm baths or showers. Avoid hot water.  ? Put on lotion within 3  minutes of bathing to lock in moisture.  Medicines   Take or apply your medicines only as told.   If you were given antibiotics, take or apply them as told. Do not stop using them even if you start to feel better.  General instructions   Use skin creams or lotions as told.   Avoid triggers and allergens that make your symptoms worse.   Keep fingernails short to avoid scratching open the skin.   Use waterproof gloves to protect your hands when doing work that keeps your hands wet for a long time.   Limit how often you wear socks or shoes.  ? If you have to wear socks, wear cotton socks that absorb moisture.  ? Choose loose-fitting shoes.   Do not smoke, vape, or use nicotine or tobacco.   Keep all follow-up visits to make sure your treatment plan is working.  Contact a health care provider if:   You have symptoms that don't go away.   You have signs of infection, such as:  ? Crusting, pus, or a bad smell.  ? More redness, swelling, or pain.  ? More warmth in the affected area.   Your skin has red streaks that are painful.  This information is not intended to replace advice given to you by your health care provider. Make sure you discuss any questions you have with your health care provider.  Document Released: 2016-11-25 Document Updated: 2022-12-14 Document Reviewed: 2022-12-14  Elsevier Patient Education ? 2025 Elsevier [employer]  Betamethasone  Cream, Gel, Lotion, or Ointment  What is this medication?  BETAMETHASONE  (bay ta METH a sone) reduces swelling, redness, itching, or rashes caused by skin conditions, such as eczema and psoriasis. It works by decreasing inflammation of the skin. It belongs to a group of medications called topical steroids.  This medicine may be used for other purposes; ask your health care provider or pharmacist if you have questions.  COMMON BRAND NAME(S): Alphatrex , Beta Derm, Beta-Val , Betanate, Betatrex , Del-Beta, Diprolene , Diprolene  AF, Diprosone , Maxivate, RRB Pak, Valisone   What should I  tell my care team before I take this medication?  They need to know if you have any of these conditions:   Large areas of burned or damaged skin   Skin infection   Thinning or wrinkling of the skin   An unusual or allergic reaction to betamethasone , steroids, other medications, foods, dyes, or preservatives   Pregnant or trying to get pregnant   Breast-feeding  How should I use this medication?  This medication is for external use only. Do not take by mouth. Wash your hands before and after use. If you are treating your hands, only wash your  hands before use. Do not use on healthy skin or over large areas of skin. Do not get this medication in your eyes. If you do, rinse it out with plenty of cool tap water. Use it as directed on the prescription label at the same time every day. Do not use it more often than directed or for a longer time period than prescribed by your care team. Keep using it unless your care team tells you to stop.  Apply a thin film to the affected area and rub gently. Do not bandage or wrap the skin being treated unless directed to do so by your care team.  Talk to your care team about the use of this medication in children. While it may be prescribed for children as young as 13 years for selected conditions, precautions do apply. Do not use this medication for diaper rash unless directed to do so by your care team. If applying this medication to the diaper area, do not cover with tight-fitting diapers or plastic pants. This may increase the amount of medication that passes through the skin and increase the risk of serious effects.  People older than 65 years are more likely to have damaged skin through aging, and this may increase side effects. This medication should only be used for brief periods and infrequently in older people.  Overdosage: If you think you have taken too much of this medicine contact a poison control center or emergency room at once.  NOTE: This medicine is only for you. Do  not share this medicine with others.  What if I miss a dose?  If you miss a dose, use it as soon as you can. If it is almost time for your next dose, use only that dose. Do not use double or extra doses.  What may interact with this medication?  Interactions are not expected. Do not use any other skin products on the same area of skin without talking to your care team.  This list may not describe all possible interactions. Give your health care provider a list of all the medicines, herbs, non-prescription drugs, or dietary supplements you use. Also tell them if you smoke, drink alcohol, or use illegal drugs. Some items may interact with your medicine.  What should I watch for while using this medication?  Visit your care team for regular checks on your progress. Tell your care team if your symptoms do not start to get better or if they get worse.  Call your care team if you are around anyone with measles, chickenpox, or if you develop sores or blisters that do not heal properly.  This medication can damage and reduce the effect of latex-containing products such as condoms and diaphragms. Avoid contact of this medication with latex-containing products; throw away any products that are exposed to this medication.  What side effects may I notice from receiving this medication?  Side effects that you should report to your care team as soon as possible:   Allergic reactions?skin rash, itching, hives, swelling of the face, lips, tongue, or throat   Burning, itching, crusting, or peeling of treated skin   Fragile or thinning skin that bruises easily   Skin infection?skin redness, swelling, warmth, or pain   Small, red, pus-filled bumps on skin around hair follicles  Side effects that usually do not require medical attention (report to your care team if they continue or are bothersome):   Mild skin irritation, redness, or dryness   Unexpected hair growth  at application site  This list may not describe all possible side  effects. Call your doctor for medical advice about side effects. You may report side effects to FDA at 1-800-FDA-1088.  Where should I keep my medication?  Keep out of the reach of children and pets.  Store between 15 and 30 degrees C (59 and 86 degrees F). Do not freeze. Protect from light. Get rid of any unused medication after the expiration date.  To get rid of medications that are no longer needed or have expired:   Take the medication to a medication take-back program. Check with your pharmacy or law enforcement to find a location.   If you cannot return the medication, ask your pharmacist or care team how to get rid of this medication safely.  NOTE: This sheet is a summary. It may not cover all possible information. If you have questions about this medicine, talk to your doctor, pharmacist, or health care provider.  ? 2025 Elsevier/Gold Standard (2021-08-14)

## 2024-01-17 NOTE — ED Provider Notes (Cosign Needed)
 [facility]   [address], [place], [place] [address]        HPI   Chief Complaint   Patient presents with    Rash        Patient reports having a rash to her right leg 3 weeks ago then 4 days ago started with a pruritic rash to bilateral hands. She spoke with her PCP yesterday on the phone and they prescribed .1% triamcinolone  cream but symptoms have not improved and she is having difficulty sleeping. She has an appointment with dermatology in 2 days. She denies any changes in creams/products/lotions/detergents/foods/medications or contact with someone with similar symptoms.  She does do a lot of gardening but does not recall any specific exposures. She otherwise feels well denies any fevers, chills, lightheadedness, other body aches/pains, HA, cold symptoms, sore throat, cough, chest pain, sob, neck pain, back pain, n/v, abdominal pain, bowel/bladder irregularities, extremity weakness/numbness/tingling/swelling/pain or other somatic complaints.        History provided by:  Medical records and patient  History limited by: none.  Language interpreter used: No        Patient History   Medical History[1]  Surgical History[2]  Family History[3]  Social History     Tobacco Use    Smoking status: Never    Smokeless tobacco: Never   Vaping Use    Vaping status: Never Used   Substance Use Topics    Alcohol use: Never    Drug use: Never       Review of Systems   Review of Systems   All other systems reviewed and are negative.      Physical Exam   ED Triage Vitals   Temp Pulse Resp BP   01/17/24 0451 01/17/24 0451 01/17/24 0451 01/17/24 0451   36.6 C (97.9 F) 80 18 122/75      SpO2 Temp Source Heart Rate Source Patient Position   01/17/24 0451 01/17/24 0451 01/17/24 0451 01/17/24 0451   95 % Oral Monitor Sitting      BP Location FiO2 (%)     01/17/24 0735 --     Left arm                      Physical Exam  Vitals and nursing note reviewed.   Constitutional:       General: She is not in acute  distress.     Appearance: Normal appearance. She is well-developed.   HENT:      Head: Normocephalic and atraumatic.      Mouth/Throat:      Mouth: Mucous membranes are moist.      Pharynx: Oropharynx is clear.   Eyes:      Extraocular Movements: Extraocular movements intact.      Conjunctiva/sclera: Conjunctivae normal.      Pupils: Pupils are equal, round, and reactive to light.   Cardiovascular:      Rate and Rhythm: Normal rate and regular rhythm.      Pulses: Normal pulses.      Heart sounds: No murmur heard.  Pulmonary:      Effort: Pulmonary effort is normal. No respiratory distress.      Breath sounds: Normal breath sounds.   Abdominal:      General: Abdomen is flat. Bowel sounds are normal.      Palpations: Abdomen is soft.      Tenderness: There is no abdominal tenderness.   Musculoskeletal:  General: No swelling or tenderness. Normal range of motion.      Cervical back: Normal range of motion and neck supple.   Skin:     General: Skin is warm and dry.      Findings: Rash (bilateral hands/feet see attached images) present.   Neurological:      General: No focal deficit present.      Mental Status: She is alert and oriented to person, place, and time.   Psychiatric:         Mood and Affect: Mood normal.         Behavior: Behavior normal.       No orders to display       Labs Reviewed - No data to display    Glasgow Coma Scale Score: 15      ED Course & MDM   Diagnoses as of 01/17/24 2347   Pruritic rash       Medical Decision Making  Patient presents with a pruritic rash involving bilateral hands, preceded by a rash on the right leg three weeks ago. Symptoms began approximately four days ago and have not improved with topical triamcinolone  0.1% cream prescribed by her PCP. She reports difficulty sleeping due to pruritus. No systemic symptoms or signs of secondary infection/cellulitis. Denies new exposures, irritants, or contact allergens. No history of similar symptoms in the past. Notably, she does  frequent gardening but does not recall any specific triggering exposures. She has a scheduled dermatology appointment in 2 days.    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 No signs of superinfection or systemic involvement. Plan to treat with high potency steroid and antihistamines until dermatology evaluation. No red flag features or signs of alternative diagnosis at this time. Patient otherwise appears well and without constitutional or neurologic complaints. Discussed reasons to return to the ER and she demonstrated understanding and is reliable to follow up.    Problems Addressed:  Pruritic rash: complicated acute illness or injury    Risk  Prescription drug management.                            [1]   Past Medical History:  Diagnosis Date    Acoustic neuroma (Multi-HCC)     Chronic GERD     Depression with anxiety     Essential hypertension      Family hx of colon cancer     Hyperlipidemia      Lichen sclerosus     Osteopenia determined by x-ray     Vitamin D  deficiency    [2]   Past Surgical History:  Procedure Laterality Date    ADENOIDECTOMY      APPENDECTOMY      RECTAL SURGERY  1973    rectal fissurectomies    TONSILLECTOMY     [3]   Family History  Problem Relation Name Age of Onset    Colon cancer Mother          Tinnie [place], [place]  01/17/24 2347

## 2024-01-18 ENCOUNTER — Inpatient Hospital Stay: Admit: 2024-01-18 | Payer: MEDICARE | Primary: Family Medicine

## 2024-01-18 DIAGNOSIS — M8580 Other specified disorders of bone density and structure, unspecified site: Secondary | ICD-10-CM

## 2024-01-18 DIAGNOSIS — Z1231 Encounter for screening mammogram for malignant neoplasm of breast: Principal | ICD-10-CM

## 2024-01-20 ENCOUNTER — Ambulatory Visit: Payer: MEDICARE | Primary: Family Medicine

## 2024-01-20 NOTE — Progress Notes (Unsigned)
 SLEEP MEDICINE CONSULTATION/TELEHEALTH VISIT    This real-time, interactive virtual Telehealth encounter was done by video with the patient's verbal consent. Two patient identifiers were used and confirmed. Physical location of the patient: home Patient resides in: [place]  Physical location of the provider: office. 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 female with medical history of HTN, GERD, acoustic neuroma, depression, anxiety, vit D deficiency, HLD who presents for evaluation of OSA. Part of the history is taken from previous medical records reviewing notes, images and laboratory values.    Background History: WatchPAT 04/26/23 showed evidence of mild to moderate OSA with total 4% AHI 12.4/h, supine AHI 16.2/h, non-supine AHI 4.3/h, REM AHI 16.9/h, CAI 0/h, O2 nadir 83% and at or below 88% for a total of 37.8 minutes. She originally underwent this sleep study with concerns of poor energy, poor sleep quality, and thinking that she had sleep apnea. However, she was in a deep depression for 5-6 months including at the time of doing this WatchPAT. She is now out of the depression and feels 100% better now.     Interval History: Patient has been experiencing some difficulty using CPAP consistently.  She came down with a cold recently and had some significant congestion and had trouble tolerating the pressures because of this congestion.  She now feels better.  However, she has some questions about cleaning the machine and if she has not of time and her compliance.  Can be compliance.  She is using a nasal interface which works fairly well. She did speak with someone at [facility] last week.     Flare ups of  eczema on hand/feet with significant itching. She had to go to ED in mdidle of the night because of this. Has not been able to use CPAP the past couple of nights becuase of the itching. Taking ABX, claritin, steroid. Derm thinks it might be due to a bite on leg that might have triggered it    Data looks very good, AHI therapeutic. CPAP has been wonderful.   Much more energy during the day. She usually felt significant sleepiness later in the day even after waking up later in the morning like 9 or 10AM  Nasal interface.     SLEEP/WAKE:  Bedtime: 11PM  Wake time: 6AM  Sleep-onset latency: 5-10 minutes  Bedroom partner: Husband  Use of hypnotic: Quetiapine , trazodone  25mg   Awakenings from sleep: 2x/night  Difficulty falling back to sleep: No  Refreshed upon waking: Yes    SLEEP-RELATED SYMPTOMS:  Snoring: Yes  Witnessed apneas: Yes  Gasping/Choking: Yes  Dry mouth: Yes  Morning HA: No    Epworth Sleepiness Score (0-24):     Review of Systems:   Denies pertinent ROS except as noted in HPI.     Previous Sleep Studies: See above    CPAP usage data:      Current Outpatient Medications   Medication Instructions    atorvastatin  (LIPITOR) 20 mg, oral, Daily    betamethasone  dipropionate (Diprosone ) 0.05 % ointment 1 Application, Topical, 2 times daily, Apply to affected areas for 2-4 weeks    calcium carbonate-vitamin D3 600 mg-10 mcg (400 unit) tablet 1 tablet,  Daily    desvenlafaxine (PRISTIQ) 100 mg, Daily    losartan  (COZAAR ) 50 mg, oral, Daily    omeprazole  (PRILOSEC) 20 mg, oral, Daily before breakfast, Do not crush or chew.     traZODone  (DESYREL ) 50 mg, oral, Nightly, 1/2 every night    triamcinolone  (Kenalog ) 0.1 % cream Topical, 2 times daily    ZOLMitriptan  (ZOMIG ) 5 mg, oral, Once as needed, May repeat in 2 hours if unresolved. Do not exceed 10 mg in 24 hours.        Allergies   Allergen Reactions    Codeine     Eucalyptus     Sulfa (Sulfonamide Antibiotics) Rash       Past Medical History:   Diagnosis Date     Acoustic neuroma (Multi-HCC)     Chronic GERD     Depression with anxiety     Essential hypertension      Family hx of colon cancer     Hyperlipidemia      Lichen sclerosus     Osteopenia determined by x-ray     Vitamin D  deficiency        Patient Active Problem List    Diagnosis Date Noted    Acoustic neuroma (Multi-HCC) 12/27/2020    Chronic GERD 12/27/2020    Depression with anxiety 12/27/2020    Essential hypertension  12/27/2020    Family hx of colon cancer 12/27/2020    Hyperlipidemia  12/27/2020    Lichen sclerosus 12/27/2020    Osteopenia determined by x-ray 12/27/2020    Vitamin D  deficiency 12/27/2020    Back problem 12/27/2020    Displacement of lumbar intervertebral disc without myelopathy 12/27/2020    Low back pain 12/27/2020    Plantar fasciitis 12/27/2020    Spinal stenosis of lumbar region 12/27/2020    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   General appearance: No acute distress. Pleasant and cooperative with history and examination.  Head: Normocephalic, atraumatic  Ear, Nose, and Throat: Mallampati class unable to determine virtually   Neck: Supple.    Lungs: Breathing comfortable on room air.  Extremities: No appreciable edema.   Neurological: Alert, attentive, and cooperative. Normal language output and comprehension.   Psychiatric: Normal mood and affect.    Assessment and Plan:     No diagnosis found.    Hannah [name] is a pleasant 78 y.o. female with medical history as noted above who presents for evaluation of OSA    #OSA, mild to moderate: Patient  has been using and benefiting from CPAP, and I recommend continued use.  We reviewed CPAP usage data in detail which shows therapeutic residual AHI of 2.3/ with acceptable mask leak. Usage has been inconsistent primarily due to upper respiratory illness recently. She was able to use the machine for a about a week with good usage times and AHI of 2/h.  She had some questions regarding cleaning the machine parts which we reviewed today.  She is motivated to use CPAP. I notified DME that she plans to continue use and to meet compliance. We will plan to f/up in a couple of months to review usage data and compliance to therapy. I have asked patient to reach out sooner with questions or concerns.     #Sleep Hygiene: Bedroom should be kept cool and dark and used only for sleeping. No screentime for at least 30 to 60 minutes before bed. Limit caffeine consumption, particularly after noon. Avoid nicotine and alcohol before bed. Avoid eating late. If cannot fall asleep within 20 minutes, get out of bed and do something relaxing and distracting in another room. Return to bed only when sleepy and repeat as needed. Wake up at the same time every day.  Get early morning sunlight when possible. Encouraged cardiovascular exercise daily for 20-30 minutes, but avoid strenuous exercise close to bedtime.     #Avoid drowsy driving.  If sleepy while driving, patient should pull over, take a nap, and resume driving when refreshed and alert.     #Patient to reach out via phone or MyChart with any questions or concerns in the interim.     #Follow up: 3 months  ______________________    Camellia Hermanns, PA-C  [facility]  [facility]

## 2024-01-24 ENCOUNTER — Ambulatory Visit: Admit: 2024-01-24 | Discharge: 2024-01-24 | Payer: MEDICARE | Attending: Otolaryngology | Primary: Family Medicine

## 2024-01-24 DIAGNOSIS — H90A21 Sensorineural hearing loss, unilateral, right ear, with restricted hearing on the contralateral side: Principal | ICD-10-CM

## 2024-01-24 MED ORDER — azelastine (Astelin) 137 mcg (0.1 %) nasal spray
137 | Freq: Two times a day (BID) | NASAL | 3 refills | 30.00000 days | Status: DC
Start: 2024-01-24 — End: 2024-04-20

## 2024-01-24 NOTE — Progress Notes (Signed)
 [facility]  [facility]  [address]  [place] [address]  Dept: [phone]  Dept Fax: [phone]     Chief Complaint: Hannah [name] is a 78 y.o. female who presents for evaluation of hearing loss.     Subjective   She did obtain hearing aids ([employer]). Over the last two years, she has a menthol sensation in the lower lip region.  She is scheduled for a MRI Brain on 02/01/24.  In the last one year, she has noticed bilateral tinnitus.   She has episodes of throat clearing and coughing after COVID infection several months ago.   She does report nasal congestion which alternates nostril and sometimes post-nasal drainage. No known seasonal or perennial allergies. She has been living in [place]  during the winter over the past 12 years except this past one year.   She takes prilosec for heartburn which she has been taking for thirty years.   When she doubled the dose of prilosec, she noticed diarrhea.   She did notice improvement in symptoms when taking claritin but symptoms have not resolved.     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 She hears a sound in the left ear when moving her eyes. Two weeks ago she started wellbutrin  and suddenly noticed a white noise sound in both ears. The right ear did sound muffled at that time which was a sudden change.    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   Patient's hearing is decreased compared to the previous audiogram.   01/25/23 Audiogram: stable hearing loss in the right ear.     Past Medical History:   Diagnosis Date    Acoustic neuroma (Multi-HCC)     Chronic GERD     Depression with anxiety     Essential hypertension      Family hx of colon cancer     Hyperlipidemia      Lichen sclerosus     Osteopenia  determined by x-ray     Vitamin D  deficiency         Past Surgical History:   Procedure Laterality Date    ADENOIDECTOMY      APPENDECTOMY      RECTAL SURGERY  1973    rectal fissurectomies    TONSILLECTOMY          Family History   Problem Relation Name Age of Onset    Colon cancer Mother          Social History     Socioeconomic History    Marital status: Married     Spouse name: Not on file    Number of children: Not on file    Years of education: Not on file    Highest education level: Not on file   Occupational History    Not on file   Tobacco Use    Smoking status: Never    Smokeless tobacco: Never   Vaping Use    Vaping status: Never Used   Substance and Sexual Activity    Alcohol use: Never    Drug use: Never    Sexual activity: Defer   Other Topics Concern    Not on file   Social History Narrative    Not on file  Social Determinants of Health     Financial Resource Strain: Low Risk  (01/17/2024)    Overall Financial Resource Strain (CARDIA)     Difficulty of Paying Living Expenses: Not hard at all   Food Insecurity: Unknown (01/17/2024)    Hunger Vital Sign     Worried About Running Out of Food in the Last Year: Never true     Ran Out of Food in the Last Year: Not on file   Transportation Needs: No Transportation Needs (01/17/2024)    PRAPARE - Therapist, art (Medical): No     Lack of Transportation (Non-Medical): No   Physical Activity: Sufficiently Active (03/24/2022)    Exercise Vital Sign     Days of Exercise per Week: 7 days     Minutes of Exercise per Session: 30 min   Stress: No Stress Concern Present (03/24/2022)    [employer] of Occupational Health - Occupational Stress Questionnaire     Feeling of Stress : Only a little   Social Connections: Socially Integrated (03/24/2022)    Social Connection and Isolation Panel [NHANES]     Frequency of Communication with Friends and Family: Three times a week     Frequency of Social Gatherings with Friends and Family: More than  three times a week     Attends Religious Services: 1 to 4 times per year     Active Member of [employer] or Organizations: Yes     Attends Banker Meetings: Never     Marital Status: Married   Catering manager Violence: Not At Risk (03/24/2022)    Humiliation, Afraid, Rape, and Kick questionnaire     Fear of Current or Ex-Partner: No     Emotionally Abused: No     Physically Abused: No     Sexually Abused: No   Housing Stability: Unknown (01/17/2024)    Housing Stability Vital Sign     Unable to Pay for Housing in the Last Year: Not on file     Number of Times Moved in the Last Year: Not on file     Homeless in the Last Year: No        Current Outpatient Medications   Medication Sig Dispense Refill    atorvastatin  (Lipitor) 20 mg tablet Take 1 tablet by mouth once daily 90 tablet 1    azelastine (Astelin) 137 mcg (0.1 %) nasal spray Administer 2 sprays into each nostril twice daily. Use in each nostril as directed 30 mL 3    betamethasone  dipropionate (Diprosone ) 0.05 % ointment Apply 1 Application topically twice daily for 14 days. Apply to affected areas for 2-4 weeks 45 g 0    calcium carbonate-vitamin D3 600 mg-10 mcg (400 unit) tablet Take 1 tablet by mouth once daily.      desvenlafaxine (Pristiq) 100 mg 24 hr tablet Take 100 mg by mouth once daily. Do not crush, chew, or split.      losartan  (Cozaar ) 50 mg tablet Take 1 tablet by mouth once daily 90 tablet 1    omeprazole  (PriLOSEC) 20 mg DR capsule Take 1 capsule (20 mg) by mouth before breakfast. Do not crush or chew. 90 capsule 1    traZODone  (Desyrel ) 50 mg tablet Take 1 tablet (50 mg) by mouth at bedtime. 1/2 every night 1 tablet 0    triamcinolone  (Kenalog ) 0.1 % cream Apply topically twice daily. 30 g 1    ZOLMitriptan  (Zomig ) 5 mg tablet Take 1 tablet (  5 mg) by mouth 1 (one) time if needed for migraine. May repeat in 2 hours if unresolved. Do not exceed 10 mg in 24 hours. 6 tablet 1     No current facility-administered medications for this  visit.        Allergies   Allergen Reactions    Codeine     Eucalyptus     Sulfa (Sulfonamide Antibiotics) Rash        Review of Systems:  Pertinent positives and HEENT review of systems noted in HPI.    Objective   There were no vitals taken for this visit.    ENT Physical Exam    Constitutional:  General Appearance: No apparent distress, alert.   Orbits  Extraocular movements are intact, no spontaneous nystagmus.   Ear:  Right: Pinna without lesions. Cerumen occluding canal - extracted. External auditory canal is clear without edema or erythema. The tympanic membrane is intact without evidence of middle ear effusion.   Left: Pinna without lesions. Cerumen occluding canal - extracted. External auditory canal is clear without edema or erythema. The tympanic membrane is intact without evidence of middle ear effusion.   Facial nerve: Facial movement is intact and symmetric.   Nose: Speculum exam: Anterior nasal cavity is clear. The caudal septal mucosa is normal. The anterior face of the bilateral inferior turbinates is unremarkable. No obvious purulence or polyps.  Oral cavity/Oropharynx: Lips: No lesions. Floor of mouth: without lesions. Tongue: symmetric movement, no masses. Palate: no masses, symmetric elevation, uvula midline. Oropharynx:  no erythema or exudate.  No masses or lesions.   Posterior oropharyngeal wall without mass or lesion.  Normal mucosa.   Larynx/Hypopharynx: voice is normal.  Neck: Thyroid : No palpable nodules. Palpation: No masses, adenopathy.   Skin of Head/Neck: No obvious lesions.   Cranial Nerve assessment:  Nerves 3 - 12: symmetric bilaterally.    Procedure: Flexible Sinonasal Endoscopy    Procedure was performed at today's visit for the convenience of the patient. The indications were reviewed with the patient. Patient understands that this is a procedure and will be billed as such.    Indication: Evaluating and inspecting the interior nasal cavity, inferior meati, middle meati, superior  meati, turbinates, sphenoethmoidal recess, frontal recess, and nasopharynx due to persistent nasal congestion.    Details of the Procedure: Bilateral flexible nasal endoscopy performed    Findings: After evaluation using anterior rhinoscopy with a nasal speculum and headlight, a mutual decision was made to have them undergo endoscopy using the zero nasal endoscope for adequate visualization due to the above symptoms. Nasal cavity: septum as noted above, the inferior turbinate, middle, and superior turbinates are unremarkable without polyps or masses. The inferior and middle meati are clear without inspissated secretions. The sphenoethmoidal recesses are clear.  Nasopharynx: eustachian tube orifice is clear. The fossa of rosenmuller is clear without masses. Posterior wall mucosa is clear.   Oropharynx: base of tongue symmetric, valleculae is clear.  Hypopharynx: Clear piriform sinuses and post-cricoid space. Posterior hypopharyngeal wall is unremarkable.  Supraglottis: Epiglottis is crisp, clear lingual and laryngeal surfaces. Arytenoids are unremarkable.   Glottis: Bilateral true vocal folds are symmetric and mobile. No lesions. Airway is patent.    Patient tolerated the procedure well.          Assessment/Plan   Hannah [name] was seen today for tinnitus.  Sensorineural hearing loss (SNHL) of right ear with restricted hearing of left ear  Bilateral impacted cerumen  Bilateral nonpulsatile tinnitus  PND (post-nasal drip)  -  azelastine (Astelin) 137 mcg (0.1 %) nasal spray; Administer 2 sprays into each nostril twice daily. Use in each nostril as directed  Nasal congestion  -     azelastine (Astelin) 137 mcg (0.1 %) nasal spray; Administer 2 sprays into each nostril twice daily. Use in each nostril as directed      She has multiple complaints including lower lip burning sensation, tinnitus, and nasal congestion/post-nasal drip and throat clearing. Exam is overall unremarkable. Will f/u on MRI Brain. She reports  improvement with claritin. Will trial azelastine.     I spent a total of 45 minutes caring for this patient on the date of the encounter. This includes face-to-face time during the visit as well as non face-to-face time spent on chart review, documentation, and care coordination.       Hannah [name] feels comfortable with the plan as outlined above. All questions and concerns were addressed in full detail. I will be contacted if there are any persistent, worsening, recurrent or new problems or concerns. Potential worrisome issues and symptoms reviewed. It was a pleasure meeting with Hannah [name] today.

## 2024-01-26 ENCOUNTER — Encounter: Payer: MEDICARE | Attending: Otolaryngology | Primary: Family Medicine

## 2024-02-01 ENCOUNTER — Ambulatory Visit: Payer: MEDICARE | Primary: Family Medicine

## 2024-02-04 ENCOUNTER — Inpatient Hospital Stay: Admit: 2024-02-04 | Payer: MEDICARE | Primary: Family Medicine

## 2024-02-04 DIAGNOSIS — D333 Benign neoplasm of cranial nerves: Principal | ICD-10-CM

## 2024-02-04 MED ORDER — gadoterate meglumine (Dotarem/Clariscan) 0.5 mmol/mL injection 16 mL
0.5 | Freq: Once | INTRAVENOUS | Status: AC
Start: 2024-02-04 — End: 2024-02-04
  Administered 2024-02-04: 23:00:00 16 mL via INTRAVENOUS

## 2024-02-06 ENCOUNTER — Ambulatory Visit: Payer: MEDICARE | Primary: Family Medicine

## 2024-02-09 NOTE — Telephone Encounter (Signed)
 Spoke with patient who called in today with CPAP questions.  She has been without CPAP for a while now due to significant itching of hands/feet that is prevented her from using the CPAP.  She is now feeling better, and planning to resume consistent use of CPAP.  She is motivated to do so.  She did receive a message from the CPAP coach from [facility] about her compliance.  She plans to contact DME to discuss this message further.  Encouraged patient to begin use of CPAP tonight and use whenever sleeping.

## 2024-03-06 ENCOUNTER — Encounter: Payer: MEDICARE | Attending: Otolaryngology | Primary: Family Medicine

## 2024-03-21 ENCOUNTER — Ambulatory Visit: Admit: 2024-03-21 | Discharge: 2024-03-21 | Payer: MEDICARE | Attending: Family Medicine | Primary: Family Medicine

## 2024-03-21 DIAGNOSIS — R053 Chronic cough: Principal | ICD-10-CM

## 2024-03-21 NOTE — Progress Notes (Signed)
 [facility]  [address]  [address]  [place] [address]  Dept: [phone]  Dept Fax: [phone]     Patient ID: Hannah [name] is a 78 y.o. female who presents for having continuous cough for 6 months (Put camera down nose  put on nasal spray  pnd otherwise feeling much better mentally--).    Subjective   HPI  Here in follow up.   Cough since January this year.    Work up so far:   normal CXR.   Saw ENT and treated for PNdrip.    Doubled PPI, but this gave her diarrhea.     Saw GI recently for upper endoscopy.      Cough is bronchial.  Sl productive.  No wheezing.   No hx of asthma or COPD.  Describes throat discomfort/ irritation.    Cough improved over the last few weeks.  No longer has to leave church.    Long hx of depression.  Better lately on pristiq .   Tx for htn, hld. Had acoustic neuroma with left hearing loss.  Hx of GERD on long term PPI.  Has had spasmotic cough for 6 months.  No smoking hx or hx of asthma.    Some nasal congestion and PNdrip.    No hx of CAD,  no DM.    Cares for husband with dementia.    Had recent testing through neurology that showed +  ANA , but she has no diffuse joint pain,  no CBC abnormalities.  No renal dysfunction,  no malar rash.  No proteinuria     Current Outpatient Medications   Medication Instructions    atorvastatin  (LIPITOR) 20 mg, oral, Daily    azelastine  (Astelin ) 137 mcg (0.1 %) nasal spray 2 sprays, Each Nostril, 2 times daily, Use in each nostril as directed    calcium carbonate-vitamin D3 600 mg-10 mcg (400 unit) tablet 1 tablet, Daily    desvenlafaxine (PRISTIQ) 100 mg, Daily    losartan  (COZAAR ) 50 mg, oral, Daily    omeprazole  (PRILOSEC) 20 mg, oral, Daily before breakfast, Do not crush or chew.     traZODone  (DESYREL ) 50 mg, oral, Nightly, 1/2 every night    triamcinolone  (Kenalog ) 0.1 % cream Topical, 2 times daily    ZOLMitriptan  (ZOMIG ) 5 mg, oral, Once as needed, May repeat in 2 hours if unresolved. Do not exceed 10 mg in 24 hours.    All:  sulfa   soc:  no smoking,  no alcohol.    Daughter lives attached.     Fam hx:   grand daughter with hemachromatisis  Review of Systems   Constitutional: Negative.    HENT:  Positive for congestion and hearing loss.    Eyes: Negative.    Respiratory:  Positive for cough.    Cardiovascular: Negative.    Gastrointestinal: Negative.    Genitourinary: Negative.    Musculoskeletal:  Positive for joint pain.   Skin: Negative.    Neurological:  Positive for headaches.   Psychiatric/Behavioral: Negative.          Objective   Visit Vitals  BP 110/68   Temp 37 C (98.6 F)   Ht 1.524 m   Wt 77.1 kg   BMI 33.20 kg/m   BSA 1.81 m     Physical Exam  BP 110/68   Temp 37 C (98.6 F)   Ht 1.524 m   Wt 77.1 kg   BMI 33.20 kg/m  General Appearance:    Alert, cooperative, no distress, appears stated age. overweight   Head:    Normocephalic, without obvious abnormality, atraumatic   Eyes:    PERRL, conjunctiva/corneas clear, EOM's intact, fundi     benign, both eyes   Ears:    Normal TM's and external ear canals, both ears. Hearing loss on left    Nose:   Nares normal, septum midline, mucosa normal, no drainage     or sinus tenderness   Throat:   Lips, mucosa, and tongue normal; teeth and gums normal   Neck:   Supple, symmetrical, trachea midline, no adenopathy;     thyroid :  no enlargement/tenderness/nodules; no carotid    bruit or JVD   Back:     Symmetric, no curvature, ROM normal, no CVA tenderness   Lungs:     Clear to auscultation bilaterally, respirations unlabored   Chest Wall:    No tenderness or deformity    Heart:    Regular rate and rhythm, S1 and S2 normal, no murmur, rub    or gallop   Breast Exam:       Abdomen:     Soft, non-tender, bowel sounds active all four quadrants,     no masses, no organomegaly   Genitalia:     Rectal:     Extremities:   Extremities normal, atraumatic, no cyanosis or edema   Pulses:   2+ and symmetric all extremities   Skin:   Skin color, texture, turgor normal, no rashes or  lesions   Lymph nodes:   Cervical, supraclavicular, and axillary nodes normal   Neurologic:   CNII-XII intact, normal strength, sensation and reflexes     throughout      Assessment/Plan   Rozina was seen today for having continuous cough for 6 months.  Chronic cough  Comments:  CXR was normal.   tx for reactive airway.  She prefers to NOT take steroids due to her depression hx.  tx for PNdrip and reflux.  improving somewhat  Depression with anxiety  Comments:  stable , improved on pristiq  Essential hypertension   Comments:  controlled.   lytes were fine  Chronic GERD  Comments:  on PPI.   will review recent upper endoscopy  Post-nasal drip  Comments:  on astelin  spray for this  Positive ANA (antinuclear antibody)  Comments:  clinically does not have lupus          Interpretation: Negative screening.        Maintain annual screening - No additional Follow-up required

## 2024-03-27 ENCOUNTER — Telehealth: Admit: 2024-03-27 | Discharge: 2024-04-03 | Payer: MEDICARE | Attending: Otolaryngology | Primary: Family Medicine

## 2024-03-27 DIAGNOSIS — R0982 Postnasal drip: Principal | ICD-10-CM

## 2024-03-27 NOTE — Progress Notes (Signed)
 [facility]  [facility]  [address]  [place] [address]  Dept: [phone]  Dept Fax: [phone]     Chief Complaint: Hannah [name] is a 78 y.o. female who presents for televisit evaluation of hearing loss.     Subjective   She reports improvement with nasal congestion and post-nasal drip. She is using azelastine . The lower lip tingling is present but has not worsened.     Prior History: She did obtain hearing aids [employer]). Over the last two years, she has a menthol sensation in the lower lip region.  She is scheduled for a MRI Brain on 02/01/24.  In the last one year, she has noticed bilateral tinnitus.   She has episodes of throat clearing and coughing after COVID infection several months ago.   She does report nasal congestion which alternates nostril and sometimes post-nasal drainage. No known seasonal or perennial allergies. She has been living in [place]  during the winter over the past 12 years except this past one year.   She takes prilosec for heartburn which she has been taking for thirty years.   When she doubled the dose of prilosec, she noticed diarrhea.   She did notice improvement in symptoms when taking claritin but symptoms have not resolved.     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   Patient's hearing is decreased compared to the previous audiogram.   01/25/23 Audiogram: stable hearing loss in the right ear.   02/04/24 MRI Brain: No findings to explain facial numbness. Stigmata of previous left vestibular schwannoma resection.     Past Medical History:   Diagnosis Date    Acoustic neuroma (Multi-HCC)     Chronic GERD     Depression with anxiety     Essential hypertension      Family hx of colon cancer     Hyperlipidemia      Lichen sclerosus     Osteopenia determined by x-ray     Vitamin D   deficiency         Past Surgical History:   Procedure Laterality Date    ADENOIDECTOMY      APPENDECTOMY      RECTAL SURGERY  1973    rectal fissurectomies    TONSILLECTOMY          Family History   Problem Relation Name Age of Onset    Colon cancer Mother          Social History     Socioeconomic History    Marital status: Married     Spouse name: Not on file    Number of children: Not on file    Years of education: Not on file    Highest education level: Not on file   Occupational History    Not on file   Tobacco Use    Smoking status: Never    Smokeless tobacco: Never   Vaping Use    Vaping status: Never Used   Substance and Sexual Activity    Alcohol use: Never    Drug use: Never    Sexual activity: Defer   Other Topics Concern    Not on file   Social History Narrative    Not on file     Social Determinants of Health     Financial Resource Strain: Low Risk  (  01/17/2024)    Overall Financial Resource Strain (CARDIA)     Difficulty of Paying Living Expenses: Not hard at all   Food Insecurity: Unknown (01/17/2024)    Hunger Vital Sign     Worried About Running Out of Food in the Last Year: Never true     Ran Out of Food in the Last Year: Not on file   Transportation Needs: No Transportation Needs (01/17/2024)    PRAPARE - Therapist, art (Medical): No     Lack of Transportation (Non-Medical): No   Physical Activity: Sufficiently Active (03/24/2022)    Exercise Vital Sign     Days of Exercise per Week: 7 days     Minutes of Exercise per Session: 30 min   Stress: No Stress Concern Present (03/24/2022)    [employer] of Occupational Health - Occupational Stress Questionnaire     Feeling of Stress : Only a little   Social Connections: Socially Integrated (03/24/2022)    Social Connection and Isolation Panel [NHANES]     Frequency of Communication with Friends and Family: Three times a week     Frequency of Social Gatherings with Friends and Family: More than three times a week     Attends  Religious Services: 1 to 4 times per year     Active Member of [employer] or Organizations: Yes     Attends Banker Meetings: Never     Marital Status: Married   Catering manager Violence: Not At Risk (03/24/2022)    Humiliation, Afraid, Rape, and Kick questionnaire     Fear of Current or Ex-Partner: No     Emotionally Abused: No     Physically Abused: No     Sexually Abused: No   Housing Stability: Unknown (01/17/2024)    Housing Stability Vital Sign     Unable to Pay for Housing in the Last Year: Not on file     Number of Times Moved in the Last Year: Not on file     Homeless in the Last Year: No        Current Outpatient Medications   Medication Sig Dispense Refill    atorvastatin  (Lipitor) 20 mg tablet Take 1 tablet by mouth once daily 90 tablet 1    azelastine  (Astelin ) 137 mcg (0.1 %) nasal spray Administer 2 sprays into each nostril twice daily. Use in each nostril as directed 30 mL 3    calcium carbonate-vitamin D3 600 mg-10 mcg (400 unit) tablet Take 1 tablet by mouth once daily.      desvenlafaxine (Pristiq) 100 mg 24 hr tablet Take 100 mg by mouth once daily. Do not crush, chew, or split.      losartan  (Cozaar ) 50 mg tablet Take 1 tablet by mouth once daily 90 tablet 1    omeprazole  (PriLOSEC) 20 mg DR capsule Take 1 capsule (20 mg) by mouth before breakfast. Do not crush or chew. 90 capsule 1    traZODone  (Desyrel ) 50 mg tablet Take 1 tablet (50 mg) by mouth at bedtime. 1/2 every night 1 tablet 0    triamcinolone  (Kenalog ) 0.1 % cream Apply topically twice daily. 30 g 1    ZOLMitriptan  (Zomig ) 5 mg tablet Take 1 tablet (5 mg) by mouth 1 (one) time if needed for migraine. May repeat in 2 hours if unresolved. Do not exceed 10 mg in 24 hours. 6 tablet 1     No current facility-administered medications for this visit.  Allergies   Allergen Reactions    Codeine     Eucalyptus     Sulfa (Sulfonamide Antibiotics) Rash        Review of Systems:  Pertinent positives and HEENT review of systems noted  in HPI.    Assessment/Plan   PND (post-nasal drip)  Nasal congestion      She has multiple complaints including lower lip burning sensation, tinnitus, and nasal congestion/post-nasal drip and throat clearing. Exam was overall unremarkable. MRI Brain was normal. Will continue azelastine .     This real-time, interactive virtual Telehealth encounter was done by phone with the patient's verbal consent. The practitioner (or I) has/have the ability to do a real time audio/video encounter, however, the patient declined or unable to engage with this modality. Two patient identifiers were used and confirmed. Physical location of home to the patient was located in [place] at the time of the visit.  Physical location of the provider: office. Other participants/involvement: none  Total 15 minutes.     Hannah [name] feels comfortable with the plan as outlined above. All questions and concerns were addressed in full detail. I will be contacted if there are any persistent, worsening, recurrent or new problems or concerns. Potential worrisome issues and symptoms reviewed. It was a pleasure meeting with Hannah [name] today.

## 2024-03-28 ENCOUNTER — Ambulatory Visit: Payer: MEDICARE | Primary: Family Medicine

## 2024-04-04 NOTE — Telephone Encounter (Signed)
 OK to stop for a few weeks and see if myalgias improve

## 2024-04-18 MED ORDER — losartan (Cozaar) 50 mg tablet
50 | ORAL_TABLET | Freq: Every day | ORAL | 1 refills | 90.00000 days | Status: AC
Start: 2024-04-18 — End: ?

## 2024-04-18 NOTE — Telephone Encounter (Signed)
 Annual:  12/15/23    losartan  (Cozaar ) 50 mg tablet [[id number]]    Dose: 50 mg Route: oral Frequency: Daily   Dispense Quantity: 90 tablet (90 day supply) Refills: 1    Duration: -- Dispense As Written: No          Sig: Take 1 tablet by mouth once daily         Start Date: 10/18/23

## 2024-04-21 NOTE — Telephone Encounter (Signed)
 noted

## 2024-04-21 NOTE — Telephone Encounter (Signed)
 Call received: 1:00 pm    Message: patient chewed omeprazole  by mistake then ate and now feeling some burning to throat.     Attempted call. Left VM - advised to hydrate well and do gargles. Otherwise throat symptoms may be due to stomach acid. Advised to call back with any other concerns.

## 2024-06-13 ENCOUNTER — Ambulatory Visit: Payer: MEDICARE | Primary: Family Medicine

## 2024-06-18 MED ORDER — ATORVASTATIN 20 MG TABLET
20 | ORAL_TABLET | Freq: Every day | ORAL | 1 refills | 90.00000 days | Status: AC
Start: 2024-06-18 — End: ?

## 2024-06-18 NOTE — Telephone Encounter (Signed)
 Statin rx  [facility]

## 2024-07-20 NOTE — Telephone Encounter (Signed)
 Husband is getting labs done monday she is asking if you can see her at all that day. Has had a cough for a year. Informed her PH not in office today and mondays typically is not a great day to double book. She is not apposed to another day if that is better for you. She knows I may not call back today.

## 2024-07-23 NOTE — Telephone Encounter (Signed)
 Appt booked spoke with patient.

## 2024-07-24 ENCOUNTER — Inpatient Hospital Stay: Admit: 2024-07-24 | Payer: MEDICARE | Primary: Family Medicine

## 2024-07-24 ENCOUNTER — Ambulatory Visit: Admit: 2024-07-24 | Discharge: 2024-07-24 | Payer: MEDICARE | Attending: Family Medicine | Primary: Family Medicine

## 2024-07-24 DIAGNOSIS — R053 Chronic cough: Principal | ICD-10-CM

## 2024-07-24 MED ORDER — AMOXICILLIN 400 MG/5 ML ORAL SUSPENSION
400 | Freq: Two times a day (BID) | ORAL | 0 refills | 7.00000 days | Status: AC
Start: 2024-07-24 — End: ?

## 2024-07-24 MED ORDER — PREDNISONE 10 MG TABLET
10 | ORAL_TABLET | ORAL | 0 refills | 30.00000 days | Status: AC
Start: 2024-07-24 — End: 2024-08-03

## 2024-07-24 NOTE — Progress Notes (Signed)
 [facility]  [address]  [address]  [place] [address]  Dept: [phone]  Dept Fax: [phone]     Patient ID: Hannah [name] is a 78 y.o. female who presents for Follow-up (Pt c/o cough since last January after having covid. ).    Subjective  HPI  Cough for a year.   No fever. No earache.     No sinus pain.     +  Post nasal drip.  Cough   Small amount of material.     No wheezing.  Heartburn      tx with prilosec .     No chest pain.    No cough at night.        No sneezing,  no itchy eyes, no runny nose.         Some nasal congestion.     Saw ENT over the summer and tx with asteline.  Mood stable on current regimen of pristiq, seroquel , trazodone    Current Outpatient Medications   Medication Instructions    amoxicillin  (AMOXIL ) 800 mg, oral, 2 times daily    atorvastatin  (LIPITOR ) 20 mg, oral, Daily    azelastine  (Astelin ) 137 mcg (0.1 %) nasal spray 2 sprays, Each Nostril, 2 times daily, Use in each nostril as directed    calcium carbonate-vitamin D3 600 mg-10 mcg (400 unit) tablet 1 tablet, Daily    desvenlafaxine (PRISTIQ) 100 mg, Daily    losartan  (COZAAR ) 50 mg, oral, Daily    omeprazole  (PRILOSEC ) 20 mg, oral, Daily before breakfast, Do not crush or chew    predniSONE  (Deltasone ) 10 mg tablet Take 5 tablets (50 mg) by mouth once daily for 2 days, THEN 4 tablets (40 mg) once daily for 2 days, THEN 3 tablets (30 mg) once daily for 2 days, THEN 2 tablets (20 mg) once daily for 2 days, THEN 1 tablet (10 mg) once daily for 2 days.    traZODone  (DESYREL ) 50 mg, oral, Nightly, 1/2 every night    triamcinolone  (Kenalog ) 0.1 % cream Topical, 2 times daily    ZOLMitriptan  (ZOMIG ) 5 mg, oral, Once as needed, May repeat in 2 hours if unresolved. Do not exceed 10 mg in 24 hours.     All: listed  soc: never smoked.  Dogs in the home.  Forced hot air  Review of Systems   Constitutional: Negative.    HENT:  Positive for congestion.    Eyes: Negative.    Respiratory:  Positive for cough.     Cardiovascular: Negative.    Gastrointestinal: Negative.    Skin: Negative.       Objective  Visit Vitals  BP 118/86   Pulse 94   Wt 82.1 kg Comment: 181lbs   SpO2 95%   BMI 35.35 kg/m   BSA 1.86 m     Physical Exam  BP 118/86   Pulse 94   Wt 82.1 kg Comment: 181lbs  SpO2 95%   BMI 35.35 kg/m     General Appearance:    Alert, cooperative, no distress, appears stated age.  Coughing.    Head:    Normocephalic, without obvious abnormality, atraumatic   Eyes:    PERRL, conjunctiva/corneas clear, EOM's intact, fundi     benign, both eyes   Ears:    Normal TM's and external ear canals, both ears   Nose:   Nasal congestion.    Throat:   Lips, mucosa, and tongue normal; teeth and gums normal   Neck:  Supple, symmetrical, trachea midline, no adenopathy;     thyroid :  no enlargement/tenderness/nodules; no carotid    bruit or JVD   Back:     Symmetric, no curvature, ROM normal, no CVA tenderness   Lungs:     Clear to auscultation bilaterally, respirations unlabored.  Couging.  Wheezing with forced expiration   Chest Wall:    No tenderness or deformity    Heart:    Regular rate and rhythm, S1 and S2 normal, no murmur, rub    or gallop   Breast Exam:       Abdomen:     Soft, non-tender, bowel sounds active all four quadrants,     no masses, no organomegaly   Genitalia:     Rectal:     Extremities:   Extremities normal, atraumatic, no cyanosis or edema   Pulses:   2+ and symmetric all extremities   Skin:   Skin color, texture, turgor normal, no rashes or lesions   Lymph nodes:   Cervical, supraclavicular, and axillary nodes normal   Neurologic:   CNII-XII intact, normal strength, sensation and reflexes     throughout      Assessment / Plan  [name] was seen today for follow-up.  Chronic cough  Comments:  likely asthma triggered by PNdrip, reflux, environmental factors.   tx PNdrip, reflux,  check labs and CXR.    prednisone  would help  Orders:  -     CBC and differential - [facility] and non-[facility]; Future  -     Allergen,  Inhalants NE Panel; Future  -     XR CHEST 2 VIEWS; Future  -     predniSONE  (Deltasone ) 10 mg tablet; Take 5 tablets (50 mg) by mouth once daily for 2 days, THEN 4 tablets (40 mg) once daily for 2 days, THEN 3 tablets (30 mg) once daily for 2 days, THEN 2 tablets (20 mg) once daily for 2 days, THEN 1 tablet (10 mg) once daily for 2 days.  Post-nasal drip  Comments:  astelin  and zyrtec or similar  Orders:  -     amoxicillin  (Amoxil ) 400 mg/5 mL suspension; Take 10 mL (800 mg) by mouth twice daily.  Chronic GERD  Comments:  PPI and avoid triggers  Depression with anxiety  Comments:  stable on current regimen.  hesitent to use prednisone   Allergic rhinitis, unspecified seasonality, unspecified trigger  -     Allergen, Inhalants NE Panel; Future

## 2024-07-25 LAB — CBC W/DIFF
Baso Abs: 0.1 x10E3/uL (ref 0.0–0.2)
Basos: 1 %
Eos Abs: 0.1 x10E3/uL (ref 0.0–0.4)
Eos: 2 %
Hct: 49.3 % — ABNORMAL HIGH (ref 34.0–46.6)
Hgb: 15.6 g/dL (ref 11.1–15.9)
Immature Grans Abs: 0 x10E3/uL (ref 0.0–0.1)
Immature Granulocytes: 0 %
Lymphs Abs: 2 x10E3/uL (ref 0.7–3.1)
Lymphs: 31 %
MCH: 28.4 pg (ref 26.6–33.0)
MCHC: 31.6 g/dL (ref 31.5–35.7)
MCV: 90 fL (ref 79–97)
Monocytes: 8 %
MonocytesAbs: 0.5 x10E3/uL (ref 0.1–0.9)
Neutrophils Abs: 3.8 x10E3/uL (ref 1.4–7.0)
Neutrophils: 58 %
Platelets: 313 x10E3/uL (ref 150–450)
RBC: 5.49 x10E6/uL — ABNORMAL HIGH (ref 3.77–5.28)
RDW: 12.4 % (ref 11.7–15.4)
WBC: 6.5 x10E3/uL (ref 3.4–10.8)

## 2024-07-28 LAB — ALLERGEN, INHALANTS NE PANEL
D001-IgE D pteronyssinus: <0.1 kU/L
D002-IgE D farinae: <0.1 kU/L
E001-IgE Cat Dander: <0.1 kU/L
E005-IgE Dog Dander: <0.1 kU/L
E072-IgE Mouse Urine: <0.1 kU/L
G002-IgE Bermuda Grass: <0.1 kU/L
G006-IgE Timothy Grass: <0.1 kU/L
I006-IgE Cockroach German: <0.1 kU/L
IgE Total: 45 [IU]/mL (ref 6–495)
M001-IgE Penicillium chrysogen: <0.1 kU/L
M002-IgE Cladosporium herbarum: <0.1 kU/L
M003-IgE Aspergillus fumigatus: <0.1 kU/L
M006-IgE Alternaria alternata: <0.1 kU/L
T001-IgE Maple/Box Elder: <0.1 kU/L
T003-IgE Common Silver Birch: <0.1 kU/L
T006-IgE Cedar Mountain: <0.1 kU/L
T007-IgE Oak White: <0.1 kU/L
T008-IgE Elm American: <0.1 kU/L
T010-IgE Walnut: <0.1 kU/L
T011-IgE Maple Leaf Sycamore: <0.1 kU/L
T014-IgE Cottonwood: <0.1 kU/L
T015-IgE Ash White: <0.1 kU/L
T070-IgE White Mulberry: <0.1 kU/L
W001-IgE Ragweed, Short: <0.1 kU/L
W006-IgE Mugwort: <0.1 kU/L
W014-IgE Pigweed Common: <0.1 kU/L
W018-IgE Sheep Sorrel: <0.1 kU/L
